# Patient Record
Sex: Female | Born: 1955 | Race: Black or African American | Hispanic: No | Marital: Married | Smoking: Former smoker
Health system: Southern US, Community
[De-identification: ages and names within clinical notes are randomized; demographics above are authoritative.]

## PROBLEM LIST (undated history)

## (undated) DIAGNOSIS — I10 Essential (primary) hypertension: Secondary | ICD-10-CM

## (undated) DIAGNOSIS — M1990 Unspecified osteoarthritis, unspecified site: Secondary | ICD-10-CM

## (undated) DIAGNOSIS — R9430 Abnormal result of cardiovascular function study, unspecified: Secondary | ICD-10-CM

## (undated) DIAGNOSIS — T8859XA Other complications of anesthesia, initial encounter: Secondary | ICD-10-CM

## (undated) DIAGNOSIS — IMO0002 Reserved for concepts with insufficient information to code with codable children: Secondary | ICD-10-CM

## (undated) DIAGNOSIS — R51 Headache: Secondary | ICD-10-CM

## (undated) DIAGNOSIS — T4145XA Adverse effect of unspecified anesthetic, initial encounter: Secondary | ICD-10-CM

## (undated) DIAGNOSIS — J069 Acute upper respiratory infection, unspecified: Secondary | ICD-10-CM

## (undated) DIAGNOSIS — K219 Gastro-esophageal reflux disease without esophagitis: Secondary | ICD-10-CM

## (undated) HISTORY — PX: JOINT REPLACEMENT: SHX530

## (undated) HISTORY — PX: SUPERIOR OBLIQUE TUCK: SHX2465

## (undated) HISTORY — DX: Abnormal result of cardiovascular function study, unspecified: R94.30

## (undated) HISTORY — PX: TUBAL LIGATION: SHX77

## (undated) HISTORY — DX: Essential (primary) hypertension: I10

## (undated) HISTORY — DX: Reserved for concepts with insufficient information to code with codable children: IMO0002

---

## 1999-10-29 ENCOUNTER — Ambulatory Visit (HOSPITAL_COMMUNITY): Admission: RE | Admit: 1999-10-29 | Discharge: 1999-10-29 | Payer: Self-pay | Admitting: Cardiology

## 2003-10-17 ENCOUNTER — Inpatient Hospital Stay (HOSPITAL_COMMUNITY): Admission: RE | Admit: 2003-10-17 | Discharge: 2003-10-20 | Payer: Self-pay | Admitting: Orthopedic Surgery

## 2011-05-29 ENCOUNTER — Other Ambulatory Visit: Payer: Self-pay | Admitting: Orthopedic Surgery

## 2011-05-29 ENCOUNTER — Encounter (HOSPITAL_COMMUNITY): Payer: Self-pay | Admitting: Pharmacy Technician

## 2011-06-03 ENCOUNTER — Other Ambulatory Visit: Payer: Self-pay

## 2011-06-03 ENCOUNTER — Encounter (HOSPITAL_COMMUNITY)
Admission: RE | Admit: 2011-06-03 | Discharge: 2011-06-03 | Disposition: A | Discharge status: Home / Self Care | Payer: BC Managed Care – PPO | Source: Ambulatory Visit | Attending: Orthopedic Surgery | Admitting: Orthopedic Surgery

## 2011-06-03 ENCOUNTER — Encounter (HOSPITAL_COMMUNITY): Payer: Self-pay

## 2011-06-03 ENCOUNTER — Ambulatory Visit (HOSPITAL_COMMUNITY)
Admission: RE | Admit: 2011-06-03 | Discharge: 2011-06-03 | Disposition: A | Discharge status: Home / Self Care | Payer: BC Managed Care – PPO | Source: Ambulatory Visit | Attending: Orthopedic Surgery | Admitting: Orthopedic Surgery

## 2011-06-03 DIAGNOSIS — I517 Cardiomegaly: Secondary | ICD-10-CM | POA: Insufficient documentation

## 2011-06-03 DIAGNOSIS — I10 Essential (primary) hypertension: Secondary | ICD-10-CM | POA: Insufficient documentation

## 2011-06-03 DIAGNOSIS — Z01810 Encounter for preprocedural cardiovascular examination: Secondary | ICD-10-CM | POA: Insufficient documentation

## 2011-06-03 DIAGNOSIS — Z01812 Encounter for preprocedural laboratory examination: Secondary | ICD-10-CM | POA: Insufficient documentation

## 2011-06-03 DIAGNOSIS — E119 Type 2 diabetes mellitus without complications: Secondary | ICD-10-CM | POA: Insufficient documentation

## 2011-06-03 DIAGNOSIS — Z01818 Encounter for other preprocedural examination: Secondary | ICD-10-CM | POA: Insufficient documentation

## 2011-06-03 HISTORY — DX: Other complications of anesthesia, initial encounter: T88.59XA

## 2011-06-03 HISTORY — DX: Headache: R51

## 2011-06-03 HISTORY — DX: Essential (primary) hypertension: I10

## 2011-06-03 HISTORY — DX: Unspecified osteoarthritis, unspecified site: M19.90

## 2011-06-03 HISTORY — DX: Adverse effect of unspecified anesthetic, initial encounter: T41.45XA

## 2011-06-03 HISTORY — DX: Gastro-esophageal reflux disease without esophagitis: K21.9

## 2011-06-03 HISTORY — DX: Acute upper respiratory infection, unspecified: J06.9

## 2011-06-03 LAB — URINALYSIS, ROUTINE W REFLEX MICROSCOPIC
Bilirubin Urine: NEGATIVE
Glucose, UA: NEGATIVE mg/dL
Ketones, ur: NEGATIVE mg/dL
pH: 6.5 (ref 5.0–8.0)

## 2011-06-03 LAB — CBC
HCT: 40.9 % (ref 36.0–46.0)
Hemoglobin: 13.6 g/dL (ref 12.0–15.0)
MCHC: 33.3 g/dL (ref 30.0–36.0)

## 2011-06-03 LAB — COMPREHENSIVE METABOLIC PANEL
Alkaline Phosphatase: 93 U/L (ref 39–117)
BUN: 9 mg/dL (ref 6–23)
GFR calc Af Amer: >90 mL/min (ref >90)
GFR calc non Af Amer: >90 mL/min (ref >90)
Glucose, Bld: 141 mg/dL — ABNORMAL HIGH (ref 70–99)
Potassium: 3.8 mEq/L (ref 3.5–5.1)
Total Bilirubin: 0.3 mg/dL (ref 0.3–1.2)
Total Protein: 7.3 g/dL (ref 6.0–8.3)

## 2011-06-03 LAB — DIFFERENTIAL
Basophils Absolute: 0 10*3/uL (ref 0.0–0.1)
Basophils Relative: 0 % (ref 0–1)
Eosinophils Absolute: 0.1 10*3/uL (ref 0.0–0.7)
Lymphs Abs: 3.6 10*3/uL (ref 0.7–4.0)
Neutrophils Relative %: 47 % (ref 43–77)

## 2011-06-03 LAB — APTT: aPTT: 30 seconds (ref 24–37)

## 2011-06-03 LAB — SURGICAL PCR SCREEN: MRSA, PCR: NEGATIVE

## 2011-06-03 LAB — PROTIME-INR
INR: 0.84 (ref 0.00–1.49)
Prothrombin Time: 11.7 seconds (ref 11.6–15.2)

## 2011-06-03 LAB — URINE MICROSCOPIC-ADD ON

## 2011-06-03 LAB — ABO/RH: ABO/RH(D): AB POS

## 2011-06-03 LAB — HCG, SERUM, QUALITATIVE: Preg, Serum: NEGATIVE

## 2011-06-03 NOTE — Progress Notes (Signed)
Call to [facility], they report that they did stress test but its only her PCP, Dr. Sherral Hammers that can released this info.  Call to [facility]  Spoke with recording at [facility].  Requested ekg, stress test result, & OV note. Asked that it be faxed to [phone]

## 2011-06-03 NOTE — Pre-Procedure Instructions (Signed)
20 Catherine Maxwell  06/03/2011   Your procedure is scheduled on:  06/10/2011  Report to [facility] at 6:45 AM.  Call this number if you have problems the morning of surgery: [phone]   Remember:   Do not eat food:After Midnight.  May have clear liquids: up to 4 Hours before arrival.  Clear liquids include soda, tea, black coffee, apple or grape juice, broth.  Take these medicines the morning of surgery with A SIP OF WATER: Bystolic & pain medicine if needed   Do not wear jewelry, make-up or nail polish.  Do not wear lotions, powders, or perfumes. You may wear deodorant.  Do not shave 48 hours prior to surgery.  Do not bring valuables to the hospital.  Contacts, dentures or bridgework may not be worn into surgery.  Leave suitcase in the car. After surgery it may be brought to your room.  For patients admitted to the hospital, checkout time is 11:00 AM the day of discharge.   Patients discharged the day of surgery will not be allowed to drive home.  Name and phone number of your driver: spouse  Special Instructions: Incentive Spirometry - Practice and bring it with you on the day of surgery. and CHG Shower Use Special Wash: 1/2 bottle night before surgery and 1/2 bottle morning of surgery.   Please read over the following fact sheets that you were given: Pain Booklet, Coughing and Deep Breathing, Blood Transfusion Information, Total Joint Packet, MRSA Information and Surgical Site Infection Prevention

## 2011-06-04 LAB — URINE CULTURE
Colony Count: NO GROWTH
Culture  Setup Time: 201212101639

## 2011-06-04 NOTE — Consult Note (Signed)
Anesthesia:  Patient is a 55 year old female scheduled for a right TKA on 06/10/11.  Her history includes HTN, DM2, GERD, headaches, former smoker, and OA.    I was asked to review her preoperative CXR showing: Enlargement of cardiac silhouette. Bronchitic changes with question 7 mm diameter right upper lobe nodular density.  The Radiologist recommended a chest CT to exclude a pulmonary nodule.  The Radiologist Assistant was to notify the surgeon.  Defer timing of the CT scan to Dr. Sherlean Foot.  I also noted her preoperative EKG showing SR with rather diffuse T wave inversion, suggesting possible inferior, anterior, and lateral ischemia.  Her PCP Dr. Sherral Hammers had actually sent her for a stress test in April of 2010 at [facility] for an abnormal EKG from March of 2010 also showing inferior-lateral T wave abnormality.  Anterior T wave changes are somewhat more prominent now but still probably non-specific on her current EKG.  She did not report any symptoms of chest pain.  Her stress test report indicates no ischemia seen on images, EF 77%.  If no new cardiopulmonary symptoms, then anticipate she can proceed.  Labs reviewed.  Her chart was placed in the follow-up cabinet as her urine culture is still pending,

## 2011-06-09 MED ORDER — CEFAZOLIN SODIUM-DEXTROSE 2-3 GM-% IV SOLR
2.0000 g | INTRAVENOUS | Status: AC
  Administered 2011-06-10: 2 g via INTRAVENOUS
  Filled 2011-06-09: qty 50

## 2011-06-10 ENCOUNTER — Encounter (HOSPITAL_COMMUNITY): Payer: Self-pay | Admitting: *Deleted

## 2011-06-10 ENCOUNTER — Inpatient Hospital Stay (HOSPITAL_COMMUNITY)
Admission: RE | Admit: 2011-06-10 | Discharge: 2011-06-12 | DRG: 209 | Disposition: A | Discharge status: Home / Self Care | Payer: BC Managed Care – PPO | Source: Ambulatory Visit | Attending: Orthopedic Surgery | Admitting: Orthopedic Surgery

## 2011-06-10 ENCOUNTER — Inpatient Hospital Stay (HOSPITAL_COMMUNITY): Payer: BC Managed Care – PPO | Admitting: Vascular Surgery

## 2011-06-10 ENCOUNTER — Encounter (HOSPITAL_COMMUNITY): Payer: Self-pay | Admitting: Vascular Surgery

## 2011-06-10 ENCOUNTER — Encounter (HOSPITAL_COMMUNITY)
Admission: RE | Disposition: A | Discharge status: Home / Self Care | Payer: Self-pay | Source: Ambulatory Visit | Attending: Orthopedic Surgery

## 2011-06-10 DIAGNOSIS — D62 Acute posthemorrhagic anemia: Secondary | ICD-10-CM | POA: Diagnosis not present

## 2011-06-10 DIAGNOSIS — M1711 Unilateral primary osteoarthritis, right knee: Secondary | ICD-10-CM

## 2011-06-10 DIAGNOSIS — M1710 Unilateral primary osteoarthritis, unspecified knee: Principal | ICD-10-CM | POA: Diagnosis present

## 2011-06-10 DIAGNOSIS — Z87891 Personal history of nicotine dependence: Secondary | ICD-10-CM

## 2011-06-10 HISTORY — PX: TOTAL KNEE ARTHROPLASTY: SHX125

## 2011-06-10 LAB — GLUCOSE, CAPILLARY
Glucose-Capillary: 127 mg/dL — ABNORMAL HIGH (ref 70–99)
Glucose-Capillary: 152 mg/dL — ABNORMAL HIGH (ref 70–99)

## 2011-06-10 SURGERY — ARTHROPLASTY, KNEE, TOTAL
Anesthesia: Regional | Site: Knee | Laterality: Right | Wound class: Clean

## 2011-06-10 MED ORDER — ONDANSETRON HCL 4 MG/2ML IJ SOLN: 4.0000 mg | Freq: Four times a day (QID) | INTRAMUSCULAR | Status: DC | PRN

## 2011-06-10 MED ORDER — MIDAZOLAM HCL 5 MG/5ML IJ SOLN
INTRAMUSCULAR | Status: DC | PRN
  Administered 2011-06-10: 2 mg via INTRAVENOUS

## 2011-06-10 MED ORDER — ACETAMINOPHEN 650 MG RE SUPP: 650.0000 mg | Freq: Four times a day (QID) | RECTAL | Status: DC | PRN

## 2011-06-10 MED ORDER — FLEET ENEMA 7-19 GM/118ML RE ENEM: 1.0000 | ENEMA | Freq: Once | RECTAL | Status: AC | PRN

## 2011-06-10 MED ORDER — CEFAZOLIN SODIUM-DEXTROSE 2-3 GM-% IV SOLR
2.0000 g | Freq: Four times a day (QID) | INTRAVENOUS | Status: AC
  Administered 2011-06-10 – 2011-06-11 (×3): 2 g via INTRAVENOUS
  Filled 2011-06-10 (×3): qty 50

## 2011-06-10 MED ORDER — BUPIVACAINE 0.25 % ON-Q PUMP SINGLE CATH 300ML
INJECTION | Status: DC | PRN
  Administered 2011-06-10: 300 mL

## 2011-06-10 MED ORDER — LISINOPRIL 20 MG PO TABS
20.0000 mg | ORAL_TABLET | Freq: Every day | ORAL | Status: DC
  Administered 2011-06-10 – 2011-06-12 (×3): 20 mg via ORAL
  Filled 2011-06-10 (×3): qty 1

## 2011-06-10 MED ORDER — ONDANSETRON HCL 4 MG/2ML IJ SOLN
INTRAMUSCULAR | Status: DC | PRN
  Administered 2011-06-10: 4 mg via INTRAVENOUS

## 2011-06-10 MED ORDER — ACETAMINOPHEN 325 MG PO TABS
650.0000 mg | ORAL_TABLET | Freq: Four times a day (QID) | ORAL | Status: DC | PRN
  Administered 2011-06-11 – 2011-06-12 (×2): 650 mg via ORAL
  Filled 2011-06-10 (×2): qty 2

## 2011-06-10 MED ORDER — NEBIVOLOL HCL 10 MG PO TABS
10.0000 mg | ORAL_TABLET | Freq: Every day | ORAL | Status: DC
  Administered 2011-06-11 – 2011-06-12 (×2): 10 mg via ORAL
  Filled 2011-06-10 (×3): qty 1

## 2011-06-10 MED ORDER — METOCLOPRAMIDE HCL 10 MG PO TABS: 5.0000 mg | ORAL_TABLET | Freq: Three times a day (TID) | ORAL | Status: DC | PRN

## 2011-06-10 MED ORDER — ALUM & MAG HYDROXIDE-SIMETH 200-200-20 MG/5ML PO SUSP: 30.0000 mL | ORAL | Status: DC | PRN

## 2011-06-10 MED ORDER — PROPOFOL 10 MG/ML IV EMUL
INTRAVENOUS | Status: DC | PRN
  Administered 2011-06-10: 40 mg via INTRAVENOUS
  Administered 2011-06-10: 160 mg via INTRAVENOUS

## 2011-06-10 MED ORDER — INSULIN ASPART 100 UNIT/ML ~~LOC~~ SOLN
0.0000 [IU] | Freq: Three times a day (TID) | SUBCUTANEOUS | Status: DC
  Filled 2011-06-10: qty 3

## 2011-06-10 MED ORDER — AMLODIPINE-OLMESARTAN 10-40 MG PO TABS: 1.0000 | ORAL_TABLET | Freq: Every day | ORAL | Status: DC

## 2011-06-10 MED ORDER — LACTATED RINGERS IV SOLN
INTRAVENOUS | Status: DC | PRN
  Administered 2011-06-10 (×2): via INTRAVENOUS

## 2011-06-10 MED ORDER — ACETAMINOPHEN 80 MG PO CHEW: 320.0000 mg | CHEWABLE_TABLET | Freq: Four times a day (QID) | ORAL | Status: DC | PRN

## 2011-06-10 MED ORDER — BUPIVACAINE 0.25 % ON-Q PUMP SINGLE CATH 300ML
300.0000 mL | INJECTION | Status: DC
Start: 2011-06-10 — End: 2011-06-10
  Filled 2011-06-10: qty 300

## 2011-06-10 MED ORDER — BISACODYL 5 MG PO TBEC: 5.0000 mg | DELAYED_RELEASE_TABLET | Freq: Every day | ORAL | Status: DC | PRN

## 2011-06-10 MED ORDER — ONDANSETRON HCL 4 MG PO TABS: 4.0000 mg | ORAL_TABLET | Freq: Four times a day (QID) | ORAL | Status: DC | PRN

## 2011-06-10 MED ORDER — OXYCODONE HCL 5 MG PO TABS
5.0000 mg | ORAL_TABLET | ORAL | Status: DC | PRN
  Administered 2011-06-11 – 2011-06-12 (×6): 10 mg via ORAL
  Filled 2011-06-10 (×7): qty 2

## 2011-06-10 MED ORDER — METOCLOPRAMIDE HCL 5 MG/ML IJ SOLN
5.0000 mg | Freq: Three times a day (TID) | INTRAMUSCULAR | Status: DC | PRN
  Filled 2011-06-10: qty 2

## 2011-06-10 MED ORDER — DEXTROSE 5 % IV SOLN
500.0000 mg | Freq: Four times a day (QID) | INTRAVENOUS | Status: DC | PRN
  Filled 2011-06-10: qty 5

## 2011-06-10 MED ORDER — DIPHENHYDRAMINE HCL 12.5 MG/5ML PO ELIX
12.5000 mg | ORAL_SOLUTION | ORAL | Status: DC | PRN
  Filled 2011-06-10: qty 10

## 2011-06-10 MED ORDER — MENTHOL 3 MG MT LOZG
1.0000 | LOZENGE | OROMUCOSAL | Status: DC | PRN
  Filled 2011-06-10: qty 9

## 2011-06-10 MED ORDER — MECLIZINE HCL 25 MG PO TABS
25.0000 mg | ORAL_TABLET | Freq: Three times a day (TID) | ORAL | Status: DC | PRN
  Filled 2011-06-10: qty 1

## 2011-06-10 MED ORDER — OXYCODONE HCL 10 MG PO TB12
10.0000 mg | ORAL_TABLET | Freq: Two times a day (BID) | ORAL | Status: DC
  Administered 2011-06-10 – 2011-06-11 (×4): 10 mg via ORAL
  Filled 2011-06-10 (×5): qty 1

## 2011-06-10 MED ORDER — BUPIVACAINE-EPINEPHRINE 0.25% -1:200000 IJ SOLN
INTRAMUSCULAR | Status: DC | PRN
  Administered 2011-06-10: 20 mL

## 2011-06-10 MED ORDER — INSULIN ASPART 100 UNIT/ML ~~LOC~~ SOLN: 0.0000 [IU] | Freq: Every day | SUBCUTANEOUS | Status: DC

## 2011-06-10 MED ORDER — HYDROMORPHONE HCL PF 1 MG/ML IJ SOLN
0.2500 mg | INTRAMUSCULAR | Status: DC | PRN
  Administered 2011-06-10 (×2): 0.5 mg via INTRAVENOUS

## 2011-06-10 MED ORDER — DROPERIDOL 2.5 MG/ML IJ SOLN: 0.6250 mg | INTRAMUSCULAR | Status: DC | PRN

## 2011-06-10 MED ORDER — FENTANYL CITRATE 0.05 MG/ML IJ SOLN
INTRAMUSCULAR | Status: DC | PRN
  Administered 2011-06-10: 100 ug via INTRAVENOUS
  Administered 2011-06-10 (×2): 50 ug via INTRAVENOUS
  Administered 2011-06-10: 100 ug via INTRAVENOUS
  Administered 2011-06-10: 50 ug via INTRAVENOUS

## 2011-06-10 MED ORDER — ACETAMINOPHEN 10 MG/ML IV SOLN: 1000.0000 mg | Freq: Four times a day (QID) | INTRAVENOUS | Status: DC

## 2011-06-10 MED ORDER — AMLODIPINE BESYLATE 10 MG PO TABS
10.0000 mg | ORAL_TABLET | Freq: Every day | ORAL | Status: DC
  Administered 2011-06-10 – 2011-06-12 (×3): 10 mg via ORAL
  Filled 2011-06-10 (×3): qty 1

## 2011-06-10 MED ORDER — CHLORHEXIDINE GLUCONATE 4 % EX LIQD: 60.0000 mL | Freq: Once | CUTANEOUS | Status: DC

## 2011-06-10 MED ORDER — SENNOSIDES-DOCUSATE SODIUM 8.6-50 MG PO TABS: 1.0000 | ORAL_TABLET | Freq: Every evening | ORAL | Status: DC | PRN

## 2011-06-10 MED ORDER — PHENOL 1.4 % MT LIQD
1.0000 | OROMUCOSAL | Status: DC | PRN
  Filled 2011-06-10: qty 177

## 2011-06-10 MED ORDER — OLMESARTAN MEDOXOMIL 40 MG PO TABS
40.0000 mg | ORAL_TABLET | Freq: Every day | ORAL | Status: DC
  Administered 2011-06-10 – 2011-06-12 (×3): 40 mg via ORAL
  Filled 2011-06-10 (×3): qty 1

## 2011-06-10 MED ORDER — ENOXAPARIN SODIUM 40 MG/0.4ML ~~LOC~~ SOLN
40.0000 mg | SUBCUTANEOUS | Status: DC
  Administered 2011-06-11 – 2011-06-12 (×2): 40 mg via SUBCUTANEOUS
  Filled 2011-06-10 (×3): qty 0.4

## 2011-06-10 MED ORDER — LISINOPRIL-HYDROCHLOROTHIAZIDE 20-25 MG PO TABS: 1.0000 | ORAL_TABLET | Freq: Every day | ORAL | Status: DC

## 2011-06-10 MED ORDER — HYDROCHLOROTHIAZIDE 25 MG PO TABS
25.0000 mg | ORAL_TABLET | Freq: Every day | ORAL | Status: DC
  Administered 2011-06-10 – 2011-06-11 (×2): 25 mg via ORAL
  Filled 2011-06-10 (×3): qty 1

## 2011-06-10 MED ORDER — BUPIVACAINE-EPINEPHRINE PF 0.5-1:200000 % IJ SOLN
INTRAMUSCULAR | Status: DC | PRN
  Administered 2011-06-10: 30 mL

## 2011-06-10 MED ORDER — SODIUM CHLORIDE 0.9 % IV SOLN
INTRAVENOUS | Status: DC
  Administered 2011-06-10 – 2011-06-11 (×2): via INTRAVENOUS

## 2011-06-10 MED ORDER — BUPIVACAINE ON-Q PAIN PUMP (FOR ORDER SET NO CHG)
INJECTION | Status: DC
  Filled 2011-06-10: qty 1

## 2011-06-10 MED ORDER — HYDROMORPHONE HCL PF 1 MG/ML IJ SOLN
0.5000 mg | INTRAMUSCULAR | Status: DC | PRN
  Administered 2011-06-10 (×2): 1 mg via INTRAVENOUS
  Filled 2011-06-10 (×2): qty 1

## 2011-06-10 MED ORDER — SODIUM CHLORIDE 0.9 % IR SOLN
Status: DC | PRN
  Administered 2011-06-10: 3000 mL

## 2011-06-10 MED ORDER — METHOCARBAMOL 100 MG/ML IJ SOLN
500.0000 mg | INTRAMUSCULAR | Status: AC
  Administered 2011-06-10: 500 mg via INTRAVENOUS
  Filled 2011-06-10: qty 5

## 2011-06-10 MED ORDER — METHOCARBAMOL 500 MG PO TABS
500.0000 mg | ORAL_TABLET | Freq: Four times a day (QID) | ORAL | Status: DC | PRN
  Administered 2011-06-10 – 2011-06-12 (×5): 500 mg via ORAL
  Filled 2011-06-10 (×5): qty 1

## 2011-06-10 MED ORDER — SODIUM CHLORIDE 0.9 % IV SOLN: INTRAVENOUS | Status: DC

## 2011-06-10 MED ORDER — DOCUSATE SODIUM 100 MG PO CAPS
100.0000 mg | ORAL_CAPSULE | Freq: Two times a day (BID) | ORAL | Status: DC
  Administered 2011-06-10 – 2011-06-12 (×5): 100 mg via ORAL
  Filled 2011-06-10 (×5): qty 1

## 2011-06-10 MED ORDER — ZOLPIDEM TARTRATE 5 MG PO TABS: 5.0000 mg | ORAL_TABLET | Freq: Every evening | ORAL | Status: DC | PRN

## 2011-06-10 SURGICAL SUPPLY — 64 items
BANDAGE ESMARK 6X9 LF (GAUZE/BANDAGES/DRESSINGS) ×1 IMPLANT
BLADE SAGITTAL 13X1.27X60 (BLADE) ×2 IMPLANT
BLADE SAW SGTL 83.5X18.5 (BLADE) ×2 IMPLANT
BNDG ESMARK 6X9 LF (GAUZE/BANDAGES/DRESSINGS) ×2
BOWL SMART MIX CTS (DISPOSABLE) ×2 IMPLANT
CATH KIT ON Q 10IN SLV (PAIN MANAGEMENT) ×2 IMPLANT
CEMENT BONE SIMPLEX SPEEDSET (Cement) ×4 IMPLANT
CLOTH BEACON ORANGE TIMEOUT ST (SAFETY) ×2 IMPLANT
COVER BACK TABLE 24X17X13 BIG (DRAPES) ×2 IMPLANT
COVER SURGICAL LIGHT HANDLE (MISCELLANEOUS) ×2 IMPLANT
CUFF TOURNIQUET SINGLE 34IN LL (TOURNIQUET CUFF) ×2 IMPLANT
DRAPE EXTREMITY T 121X128X90 (DRAPE) ×2 IMPLANT
DRAPE INCISE IOBAN 66X45 STRL (DRAPES) ×4 IMPLANT
DRAPE PROXIMA HALF (DRAPES) ×2 IMPLANT
DRAPE U-SHAPE 47X51 STRL (DRAPES) ×2 IMPLANT
DRSG ADAPTIC 3X8 NADH LF (GAUZE/BANDAGES/DRESSINGS) ×2 IMPLANT
DRSG PAD ABDOMINAL 8X10 ST (GAUZE/BANDAGES/DRESSINGS) ×2 IMPLANT
DURAPREP 26ML APPLICATOR (WOUND CARE) ×4 IMPLANT
ELECT REM PT RETURN 9FT ADLT (ELECTROSURGICAL) ×2
ELECTRODE REM PT RTRN 9FT ADLT (ELECTROSURGICAL) ×1 IMPLANT
EVACUATOR 1/8 PVC DRAIN (DRAIN) ×2 IMPLANT
GAUZE SPONGE 4X4 12PLY STRL LF (GAUZE/BANDAGES/DRESSINGS) ×2 IMPLANT
GLOVE BIO SURGEON STRL SZ 6.5 (GLOVE) ×2 IMPLANT
GLOVE BIO SURGEON STRL SZ7.5 (GLOVE) ×2 IMPLANT
GLOVE BIO SURGEON STRL SZ8 (GLOVE) ×2 IMPLANT
GLOVE BIOGEL M 7.0 STRL (GLOVE) IMPLANT
GLOVE BIOGEL PI IND STRL 7.5 (GLOVE) ×1 IMPLANT
GLOVE BIOGEL PI IND STRL 8.5 (GLOVE) ×2 IMPLANT
GLOVE BIOGEL PI INDICATOR 7.5 (GLOVE) ×1
GLOVE BIOGEL PI INDICATOR 8.5 (GLOVE) ×2
GLOVE NEODERM STER SZ 7 (GLOVE) ×2 IMPLANT
GLOVE SURG ORTHO 8.0 STRL STRW (GLOVE) ×4 IMPLANT
GOWN PREVENTION PLUS XLARGE (GOWN DISPOSABLE) ×4 IMPLANT
GOWN STRL NON-REIN LRG LVL3 (GOWN DISPOSABLE) ×4 IMPLANT
HANDPIECE INTERPULSE COAX TIP (DISPOSABLE) ×1
HOOD PEEL AWAY FACE SHEILD DIS (HOOD) ×6 IMPLANT
IMMOBILIZER KNEE 22 UNIV (SOFTGOODS) ×2 IMPLANT
KIT BASIN OR (CUSTOM PROCEDURE TRAY) ×2 IMPLANT
KIT ROOM TURNOVER OR (KITS) ×2 IMPLANT
MANIFOLD NEPTUNE II (INSTRUMENTS) ×2 IMPLANT
NEEDLE 22X1 1/2 (OR ONLY) (NEEDLE) IMPLANT
NS IRRIG 1000ML POUR BTL (IV SOLUTION) ×2 IMPLANT
PACK TOTAL JOINT (CUSTOM PROCEDURE TRAY) ×2 IMPLANT
PAD ARMBOARD 7.5X6 YLW CONV (MISCELLANEOUS) ×4 IMPLANT
PADDING CAST COTTON 6X4 STRL (CAST SUPPLIES) ×2 IMPLANT
PADDING WEBRIL 6 STERILE (GAUZE/BANDAGES/DRESSINGS) ×2 IMPLANT
POSITIONER HEAD PRONE TRACH (MISCELLANEOUS) ×2 IMPLANT
SET HNDPC FAN SPRY TIP SCT (DISPOSABLE) ×1 IMPLANT
SPONGE GAUZE 4X4 12PLY (GAUZE/BANDAGES/DRESSINGS) ×2 IMPLANT
STAPLER VISISTAT 35W (STAPLE) ×2 IMPLANT
SUCTION FRAZIER TIP 10 FR DISP (SUCTIONS) ×2 IMPLANT
SUT BONE WAX W31G (SUTURE) ×2 IMPLANT
SUT VIC AB 0 CT1 27 (SUTURE) ×1
SUT VIC AB 0 CT1 27XBRD ANBCTR (SUTURE) ×1 IMPLANT
SUT VIC AB 0 CTB1 27 (SUTURE) ×4 IMPLANT
SUT VIC AB 1 CT1 27 (SUTURE) ×4
SUT VIC AB 1 CT1 27XBRD ANBCTR (SUTURE) ×4 IMPLANT
SUT VIC AB 2-0 CT1 27 (SUTURE) ×2
SUT VIC AB 2-0 CT1 TAPERPNT 27 (SUTURE) ×2 IMPLANT
SYR CONTROL 10ML LL (SYRINGE) IMPLANT
TOWEL OR 17X24 6PK STRL BLUE (TOWEL DISPOSABLE) ×2 IMPLANT
TOWEL OR 17X26 10 PK STRL BLUE (TOWEL DISPOSABLE) ×2 IMPLANT
TRAY FOLEY CATH 14FR (SET/KITS/TRAYS/PACK) ×2 IMPLANT
WATER STERILE IRR 1000ML POUR (IV SOLUTION) ×6 IMPLANT

## 2011-06-10 NOTE — Transfer of Care (Signed)
Immediate Anesthesia Transfer of Care Note  Patient: Catherine Maxwell  Procedure(s) Performed:  TOTAL KNEE ARTHROPLASTY  Patient Location: PACU  Anesthesia Type: General  Level of Consciousness: awake, alert  and oriented  Airway & Oxygen Therapy: Patient Spontanous Breathing and Patient connected to nasal cannula oxygen  Post-op Assessment: Report given to PACU RN, Post -op Vital signs reviewed and stable and Patient moving all extremities X 4  Post vital signs: Reviewed and stable  Complications: No apparent anesthesia complications

## 2011-06-10 NOTE — Anesthesia Preprocedure Evaluation (Addendum)
Anesthesia Evaluation  Patient identified by MRN, date of birth, ID band Patient awake    Reviewed: Allergy & Precautions, H&P , NPO status , Patient's Chart, lab work & pertinent test results, reviewed documented beta blocker date and time   Airway Mallampati: II TM Distance: >3 FB Neck ROM: Full    Dental  (+) Teeth Intact   Pulmonary Recent URI , Resolved,  clear to auscultation  Pulmonary exam normal       Cardiovascular hypertension, Pt. on home beta blockers and Pt. on medications Regular Normal- Systolic murmurs    Neuro/Psych  Headaches,    GI/Hepatic GERD-  Controlled,  Endo/Other  Diabetes mellitus-, Type 2, Oral Hypoglycemic Agents  Renal/GU      Musculoskeletal   Abdominal   Peds  Hematology   Anesthesia Other Findings   Reproductive/Obstetrics                          Anesthesia Physical Anesthesia Plan  ASA: II  Anesthesia Plan: General   Post-op Pain Management:    Induction: Intravenous  Airway Management Planned: LMA  Additional Equipment:   Intra-op Plan:   Post-operative Plan:   Informed Consent: I have reviewed the patients History and Physical, chart, labs and discussed the procedure including the risks, benefits and alternatives for the proposed anesthesia with the patient or authorized representative who has indicated his/her understanding and acceptance.     Plan Discussed with: CRNA and Surgeon  Anesthesia Plan Comments:         Anesthesia Quick Evaluation

## 2011-06-10 NOTE — Preoperative (Signed)
Beta Blockers   Reason not to administer Beta Blockers:Pt took Bystolic 06/10/11 @ 0600

## 2011-06-10 NOTE — Anesthesia Postprocedure Evaluation (Signed)
Anesthesia Post Note  Patient: Catherine Maxwell  Procedure(s) Performed:  TOTAL KNEE ARTHROPLASTY  Anesthesia type: General  Patient location: PACU  Post pain: Pain level controlled  Post assessment: Patient's Cardiovascular Status Stable  Last Vitals:  Filed Vitals:   06/10/11 1308  BP: 133/73  Pulse: 68  Temp:   Resp: 16    Post vital signs: Reviewed and stable  Level of consciousness: sedated  Complications: No apparent anesthesia complications

## 2011-06-10 NOTE — Anesthesia Procedure Notes (Addendum)
Anesthesia Regional Block:  Femoral nerve block  Pre-Anesthetic Checklist: ,, timeout performed, Correct Patient, Correct Site, Correct Laterality, Correct Procedure,, site marked, risks and benefits discussed, Surgical consent,  Pre-op evaluation,  At surgeon's request and post-op pain management  Laterality: Right  Prep: chloraprep       Needles:  Injection technique: Single-shot  Needle Type: Echogenic Stimulator Needle     Needle Length: 5cm 5 cm Needle Gauge: 22 and 22 G    Additional Needles:  Procedures: ultrasound guided and nerve stimulator Femoral nerve block  Nerve Stimulator or Paresthesia:  Response: quadraceps contraction, 0.45 mA,   Additional Responses:   Narrative:  Start time: 06/10/2011 8:30 AM End time: 06/10/2011 8:40 AM Injection made incrementally with aspirations every 5 mL.  Performed by: Personally  Anesthesiologist: Halford Decamp, MD  Additional Notes: Functioning IV was confirmed and monitors were applied.  A 50mm 22ga Arrow echogenic stimulator needle was used. Sterile prep and drape,hand hygiene and sterile gloves were used. Ultrasound guidance: relevent anatomy identified, needle position confirmed, local anesthetic spread visualized around nerve(s)., vascular puncture avoided.  Image printed for medical record. Negative aspiration and negative test dose prior to incremental administration of local anesthetic. The patient tolerated the procedure well.    Femoral nerve block Procedure Name: LMA Insertion Date/Time: 06/10/2011 9:00 AM Performed by: Rossie Muskrat Pre-anesthesia Checklist: Patient identified, Timeout performed, Emergency Drugs available, Suction available and Patient being monitored Patient Re-evaluated:Patient Re-evaluated prior to inductionOxygen Delivery Method: Circle System Utilized Preoxygenation: Pre-oxygenation with 100% oxygen Intubation Type: IV induction Ventilation: Mask ventilation with difficulty LMA: LMA  inserted LMA Size: 4.0 Number of attempts: 1 Placement Confirmation: breath sounds checked- equal and bilateral and positive ETCO2 Tube secured with: Tape Dental Injury: Teeth and Oropharynx as per pre-operative assessment

## 2011-06-10 NOTE — Op Note (Signed)
TOTAL KNEE REPLACEMENT OPERATIVE NOTE:  06/10/2011  10:04 PM  PATIENT:  Catherine Maxwell  55 y.o. female  PRE-OPERATIVE DIAGNOSIS:  OA RIGHT KNEE  POST-OPERATIVE DIAGNOSIS:  Osteoarthritis Right Knee  PROCEDURE:  Procedure(s): TOTAL KNEE ARTHROPLASTY  SURGEON:  Surgeon(s): Raymon Mutton, MD  PHYSICIAN ASSISTANT: Altamese Cabal, [facility]  ANESTHESIA:   general  DRAINS: Hemovac and On-Q Marcaine Pain Pump  SPECIMEN: None  COUNTS:  Correct  TOURNIQUET:   Total Tourniquet Time Documented: Thigh (Right) - 78 minutes  DICTATION:  Indication for procedure:    The patient is a 55 y.o. female who has failed conservative treatment for OA RIGHT KNEE.  Informed consent was obtained prior to anesthesia. The risks versus benefits of the operation were explain and in a way the patient can, and did, understand.   Description of procedure:     The patient was taken to the operating room and placed under anesthesia.  The patient was positioned in the usual fashion taking care that all body parts were adequately padded and/or protected.  I foley catheter was placed.  A tourniquet was applied and the leg prepped and draped in the usual sterile fashion.  The extremity was exsanguinated with the esmarch and tourniquet inflated to 350 mmHg.  Pre-operative range of motion was normal.  The knee was in 3 degree of mild valgus.  A midline incision approximately 6-7 inches long was made with a #10 blade.  A new blade was used to make a parapatellar arthrotomy going 2-3 cm into the quadriceps tendon, over the patella, and alongside the medial aspect of the patellar tendon.  A synovectomy was then performed with the #10 blade and forceps. I then elevated the deep MCL off the medial tibial metaphysis subperiosteally around to the semimembranosus attachment.    I everted the patella and used calipers to measure patellar thickness, which was ###.  I used the reamer to ream down to appropriate thickness to  recreated the native thickness.  I then removed excess bone with the rongeur and sagittal saw.  I used the ## mm template and drilled the three lug holes.  I then put the trial in place and measured the thickness with the calipers to ensure recreation of the native thickness.  The trial was then removed and the patella subluxed and the knee brought into flexion.  A homan retractor was place to retract and protect the patella and lateral structures.  A Z-retractor was place medially to protect the medial structures.  The extra-medullary alignment system was used to make cut the tibial articular surface perpendicular to the anamotic axis of the tibia and in 3 degrees of posterior slope.  The cut surface and alignment jig was removed.  I then used the intramedullary alignment guide to make a 4 valgus cut on the distal femur.  I then marked out the epicondylar axis on the distal femur.  The posterior condylar axis measured 5 degrees.  I then used the anterior referencing sizer and measured the femur to be a size D.  The 4-In-1 cutting block was screwed into place in external rotation matching the posterior condylar angle, making our cuts perpendicular to the epicondylar axis.  Anterior, posterior and chamfer cuts were made with the sagittal saw.  The cutting block and cut pieces were removed.  A lamina spreader was placed in 90 degrees of flexion.  The ACL, PCL, menisci, and posterior condylar osteophytes were removed.  A 10 mm spacer blocked was found to  offer good flexion and extension gap balance after moderate in degree releasing.   The scoop retractor was then placed and the femoral finishing block was pinned in place.  The small sagittal saw was used as well as the lug drill to finish the femur.  The block and cut surfaces were removed and the medullary canal hole filled with autograft bone from the cut pieces.  The tibia was delivered forward in deep flexion and external rotation.  A size 3 tray was  selected and pinned into place centered on the medial 1/3 of the tibial tubercle.  The reamer and keel was used to prepare the tibia through the tray.    I then trialed with the size D femur, size 3 tibia, a 10 mm insert and the 32 patella.  I had excellent flexion/extension gap balance, excellent patella tracking.  Flexion was full and beyond 120 degrees; extension was zero.  These components were chosen and the staff opened them to me on the back table while the knee was lavaged copiously and the cement mixed.  I cemented in the components and removed all excess cement.  The polyethylene tibial component was snapped into place and the knee placed in extension while cement was hardening.  The capsule was infilltrated with 20cc of .25% Marcaine with epinephrine.  A hemovac was place in the joint exiting superolaterally.  A pain pump was place superomedially superficial to the arthrotomy.  Once the cement was hard, the tourniquet was let down.  Hemostasis was obtained.  The arthrotomy was closed with figure-8 #1 vicryl sutures.  The deep soft tissues were closed with #0 vicryls and the subcuticular layer closed with a running #2-0 vicryl.  The skin was reapproximated and closed with skin staples.  The wound was dressed with xeroform, 4 x4's, 2 ABD sponges, a single layer of webril and a TED stocking.   The patient was then awakened, extubated, and taken to the recovery room in stable condition.  BLOOD LOSS:  300cc DRAINS: 1 hemovac, 1 pain catheter COMPLICATIONS:  None.  PLAN OF CARE: Admit to inpatient   PATIENT DISPOSITION:  PACU - hemodynamically stable.   Delay start of Pharmacological VTE agent (>24hrs) due to surgical blood loss or risk of bleeding:  not applicable

## 2011-06-10 NOTE — H&P (Signed)
Catherine Maxwell MRN:  [id number] DOB/SEX:  08-20-1955/female  CHIEF COMPLAINT:  Painful right Knee  HISTORY: Patient is a 55 y.o. female presented with a history of pain in the right knee. Onset of symptoms was gradual starting several years ago with gradually worsening course since that time. The patient noted no past surgery on the right knee. Prior procedures on the knee include arthroscopy. Patient has been treated conservatively with over-the-counter NSAIDs and activity modification. Patient currently rates pain in the knee at 9 out of 10 with activity. There is pain at night.  PAST MEDICAL HISTORY: There are no active problems to display for this patient.  Past Medical History  Diagnosis Date  . Complication of anesthesia     panicky before anesthesia   . Hypertension     stress test- in past recent - [facility]. Rossie   . Recurrent upper respiratory infection (URI)     recent cough, treated /w OTC  . Diabetes mellitus     diag. x2 yrs. ago  . GERD (gastroesophageal reflux disease)     recent reflux, took OTC tx.  . Headache     uses RX- unsure of name  . Arthritis     R knee, back problem    Past Surgical History  Procedure Date  . Joint replacement     2005- L knee, arthroscopic procedure both knees   . Cesarean section     x2  . Tummy     tummy tuck - 1986  . Tubal ligation      MEDICATIONS:   Prescriptions prior to admission  Medication Sig Dispense Refill  . ACETAMINOPHEN-BUTALBITAL (BUPAP) 50-650 MG TABS Take 1 tablet by mouth daily as needed. For pain        . amLODipine-olmesartan (AZOR) 10-40 MG per tablet Take 1 tablet by mouth daily.        Marland Kitchen BIOTIN PO Take 1 tablet by mouth daily.        . calcium carbonate (OS-CAL - DOSED IN MG OF ELEMENTAL CALCIUM) 1250 MG tablet Take 1 tablet by mouth daily.        Marland Kitchen CINNAMON PO Take 1 tablet by mouth daily.        . cyclobenzaprine (FLEXERIL) 10 MG tablet Take 10 mg by mouth 3 (three) times daily as  needed. For muscle spasms       . docusate sodium (COLACE) 100 MG capsule Take 100 mg by mouth daily.        . Ginkgo Biloba (GINKOBA PO) Take 1 tablet by mouth daily.        Marland Kitchen HYDROcodone-acetaminophen (NORCO) 5-325 MG per tablet Take 1 tablet by mouth every 6 (six) hours as needed. For pain       . lisinopril-hydrochlorothiazide (PRINZIDE,ZESTORETIC) 20-25 MG per tablet Take 1 tablet by mouth daily.        Marland Kitchen MAGNESIUM PO Take 1 tablet by mouth daily.        . meclizine (ANTIVERT) 25 MG tablet Take 25 mg by mouth 3 (three) times daily as needed. For dizziness        . metFORMIN (GLUCOPHAGE) 500 MG tablet Take 500 mg by mouth daily with breakfast.        . Multiple Vitamins-Minerals (ZINC PO) Take 1 capsule by mouth daily.        . nebivolol (BYSTOLIC) 10 MG tablet Take 10 mg by mouth daily.        Marland Kitchen omega-3 acid ethyl esters (LOVAZA)  1 G capsule Take 1 g by mouth daily.        . traMADol (ULTRAM) 50 MG tablet Take 50 mg by mouth every 6 (six) hours as needed. For pain         ALLERGIES:   Allergies  Allergen Reactions  . Celebrex (Celecoxib) Swelling    Feet swell  . Morphine And Related Itching    REVIEW OF SYSTEMS:  Pertinent items are noted in HPI.   FAMILY HISTORY:   Family History  Problem Relation Age of Onset  . Anesthesia problems Neg Hx   . Hypotension Neg Hx   . Malignant hyperthermia Neg Hx   . Pseudochol deficiency Neg Hx     SOCIAL HISTORY:   History  Substance Use Topics  . Smoking status: Former Smoker    Quit date: 06/03/1979  . Smokeless tobacco: Not on file  . Alcohol Use: Yes     wine occas     EXAMINATION:  Vital signs in last 24 hours: Temp:  [98.2 F (36.8 C)] 98.2 F (36.8 C) (12/17 0653) Pulse Rate:  [55] 55  (12/17 0653) Resp:  [20] 20  (12/17 0653) BP: (179)/(84) 179/84 mmHg (12/17 0653) SpO2:  [97 %] 97 % (12/17 0653)  General appearance: alert, cooperative and no distress Lungs: clear to auscultation bilaterally Heart: regular  rate and rhythm, S1, S2 normal, no murmur, click, rub or gallop Abdomen: soft, non-tender; bowel sounds normal; no masses,  no organomegaly Extremities: extremities normal, atraumatic, no cyanosis or edema and Homans sign is negative, no sign of DVT Pulses: 2+ and symmetric Skin: Skin color, texture, turgor normal. No rashes or lesions Neurologic: Alert and oriented X 3, normal strength and tone. Normal symmetric reflexes. Normal coordination and gait  Musculoskeletal:  ROM 0-120, Ligaments intact,  Imaging Review Plain radiographs demonstrate severe degenerative joint disease of the right knee. The overall alignment is mild varus. The bone quality appears to be good for age and reported activity level.  Assessment/Plan: End stage arthritis, right knee   The patient history, physical examination and imaging studies are consistent with advanced degenerative joint disease of the right knee. The patient has failed conservative treatment.  The clearance notes were reviewed.  After discussion with the patient it was felt that Total Knee Replacement was indicated. The procedure,  risks, and benefits of total knee arthroplasty were presented and reviewed. The risks including but not limited to aseptic loosening, infection, blood clots, vascular injury, stiffness, patella tracking problems complications among others were discussed. The patient acknowledged the explanation, agreed to proceed with the plan.  Emet Rafanan 06/10/2011, 7:11 AM

## 2011-06-11 ENCOUNTER — Encounter (HOSPITAL_COMMUNITY): Payer: Self-pay | Admitting: Orthopedic Surgery

## 2011-06-11 LAB — BASIC METABOLIC PANEL
BUN: 6 mg/dL (ref 6–23)
BUN: 7 mg/dL (ref 6–23)
CO2: 32 mEq/L (ref 19–32)
Calcium: 8.4 mg/dL (ref 8.4–10.5)
Calcium: 8.9 mg/dL (ref 8.4–10.5)
Creatinine, Ser: 0.64 mg/dL (ref 0.50–1.10)
Creatinine, Ser: 0.68 mg/dL (ref 0.50–1.10)
GFR calc Af Amer: >90 mL/min (ref >90)
GFR calc non Af Amer: >90 mL/min (ref >90)
GFR calc non Af Amer: >90 mL/min (ref >90)
Glucose, Bld: 147 mg/dL — ABNORMAL HIGH (ref 70–99)
Glucose, Bld: 166 mg/dL — ABNORMAL HIGH (ref 70–99)
Potassium: 2.5 mEq/L — CL (ref 3.5–5.1)
Sodium: 135 mEq/L (ref 135–145)

## 2011-06-11 LAB — CBC
HCT: 32.6 % — ABNORMAL LOW (ref 36.0–46.0)
Hemoglobin: 10.7 g/dL — ABNORMAL LOW (ref 12.0–15.0)
MCH: 28.9 pg (ref 26.0–34.0)
MCHC: 32.8 g/dL (ref 30.0–36.0)
MCV: 88.1 fL (ref 78.0–100.0)
RDW: 15.3 % (ref 11.5–15.5)

## 2011-06-11 LAB — GLUCOSE, CAPILLARY: Glucose-Capillary: 132 mg/dL — ABNORMAL HIGH (ref 70–99)

## 2011-06-11 LAB — HEMOGLOBIN A1C: Hgb A1c MFr Bld: 6.7 % — ABNORMAL HIGH (ref <5.7)

## 2011-06-11 MED ORDER — POTASSIUM CHLORIDE CRYS ER 20 MEQ PO TBCR
40.0000 meq | EXTENDED_RELEASE_TABLET | Freq: Two times a day (BID) | ORAL | Status: DC
  Administered 2011-06-11 – 2011-06-12 (×3): 40 meq via ORAL
  Filled 2011-06-11 (×4): qty 2

## 2011-06-11 MED ORDER — POTASSIUM CHLORIDE 10 MEQ/100ML IV SOLN
10.0000 meq | INTRAVENOUS | Status: DC
  Administered 2011-06-11: 10 meq via INTRAVENOUS
  Filled 2011-06-11 (×4): qty 100

## 2011-06-11 NOTE — Progress Notes (Signed)
CRITICAL VALUE ALERT  Critical value received:  Potassium  Date of notification:  06/11/11  Time of notification:  0840  Critical value read back:yes  Nurse who received alert:  Sharyl Nimrod, RN  MD notified (1st page):  Altamese Cabal, PA  Time of first page:  0840  MD notified (2nd page):  Time of second page:  Responding MD:  Altamese Cabal, PA  Time MD responded:  [phone]  Orders received to repeat BMET and give Potassium IV.   Tammy Sours

## 2011-06-11 NOTE — Progress Notes (Signed)
Physical Therapy Evaluation Patient Details Name: Catherine Maxwell MRN: [id number] DOB: 1955/10/27 Today's Date: 06/11/2011  Problem List: There is no problem list on file for this patient.   Past Medical History:  Past Medical History  Diagnosis Date  . Complication of anesthesia     panicky before anesthesia   . Hypertension     stress test- in past recent - [facility]. [place]   . Recurrent upper respiratory infection (URI)     recent cough, treated /w OTC  . Diabetes mellitus     diag. x2 yrs. ago  . GERD (gastroesophageal reflux disease)     recent reflux, took OTC tx.  . Headache     uses RX- unsure of name  . Arthritis     R knee, back problem    Past Surgical History:  Past Surgical History  Procedure Date  . Cesarean section     x2  . Tummy     tummy tuck - 1986  . Tubal ligation   . Joint replacement     2005- L knee, arthroscopic procedure both knees     PT Assessment/Plan/Recommendation PT Assessment Clinical Impression Statement: 55 yo female s/p LTKA presents with decr functional mobility and impairments listed below; will benefit from PT to maximize independence and safety with mobility and enable safe dc home PT Recommendation/Assessment: Patient will need skilled PT in the acute care venue PT Problem List: Decreased strength;Decreased range of motion;Decreased mobility;Decreased knowledge of use of DME;Pain PT Therapy Diagnosis : Difficulty walking;Acute pain PT Plan PT Frequency: 7X/week PT Treatment/Interventions: DME instruction;Gait training;Stair training;Functional mobility training;Therapeutic exercise;Therapeutic activities;Patient/family education PT Recommendation Follow Up Recommendations: Home health PT Equipment Recommended: Rolling walker with 5" wheels PT Goals  Acute Rehab PT Goals PT Goal Formulation: With patient Time For Goal Achievement: 7 days Pt will go Supine/Side to Sit: with modified independence Pt will go Sit to  Supine/Side: with modified independence Pt will go Sit to Stand: with modified independence Pt will go Stand to Sit: with modified independence Pt will Ambulate: >150 feet;with modified independence;with rolling walker Pt will Go Up / Down Stairs: 1-2 stairs;with modified independence;with rolling walker Pt will Perform Home Exercise Program: Independently  PT Evaluation Precautions/Restrictions  Precautions Precautions: Knee Required Braces or Orthoses: Yes Knee Immobilizer: On when out of bed or walking (KI ordered in Nursing orders section) Restrictions Weight Bearing Restrictions: Yes RLE Weight Bearing: Weight bearing as tolerated Prior Functioning  Home Living Lives With: Spouse;Daughter Receives Help From: Family Type of Home: House Home Layout: Two level;Full bath on main level;Able to live on main level with bedroom/bathroom Alternate Level Stairs-Rails:  (Pt does not have to go to second level) Home Access: Stairs to enter Entrance Stairs-Rails: None Entrance Stairs-Number of Steps: 1 Bathroom Shower/Tub: Radio producer Accessibility: Yes How Accessible: Accessible via walker Home Adaptive Equipment: Bedside commode/3-in-1 Additional Comments: Needs RW Prior Function Level of Independence: Independent with basic ADLs;Independent with homemaking with ambulation;Independent with gait;Independent with transfers Able to Take Stairs?: Yes Driving: Yes Leisure: Hobbies-yes (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[employer] Cognition Arousal/Alertness: Awake/alert Overall Cognitive Status: Appears within functional limits for tasks assessed Orientation Level: Oriented X4 Sensation/Coordination Sensation Light Touch: Appears Intact Coordination Gross Motor Movements are Fluid and Coordinated: Yes Fine Motor Movements are Fluid and Coordinated: Yes Extremity Assessment RUE Assessment RUE Assessment: Within  Functional Limits LUE Assessment LUE Assessment: Within Functional Limits RLE Assessment RLE Assessment: Within Functional Limits LLE Assessment  LLE Assessment: Exceptions to [facility] LLE Strength LLE Overall Strength Comments: grossly decr AROM and strentgh, limited by pain; very good quad setting; AAROM flexion to approx 80 degrees Mobility (including Balance) Bed Mobility Bed Mobility: Yes Sit to Supine - Left: 5: Supervision Sit to Supine - Left Details (indicate cue type and reason): cues to sccot hips for optimal position once seated on bed Transfers Transfers: Yes Sit to Stand: 4: Min assist;From chair/3-in-1;With upper extremity assist (without physical contact) Sit to Stand Details (indicate cue type and reason): good job; safe hand placement, safe transfer Stand to Sit: 4: Min assist;To bed (wothout physical contact) Stand to Sit Details: cues for hand placement Ambulation/Gait Ambulation/Gait: Yes Ambulation/Gait Assistance: 5: Supervision Ambulation/Gait Assistance Details (indicate cue type and reason): cues for sequence; cues to self monitor for activity tol; excellent job with amb today Ambulation Distance (Feet): 130 Feet Assistive device: Rolling walker Gait Pattern: Step-through pattern Stairs: Yes Stairs Assistance: 4: Min assist Stairs Assistance Details (indicate cue type and reason): cues for sequence, safety, and optimal positioning Stair Management Technique: No rails;With walker;Backwards;Forwards Number of Stairs: 1  (2 times)    Exercise  Total Joint Exercises Quad Sets: AROM;Left;10 reps Heel Slides: AAROM;10 reps;Left End of Session PT - End of Session Equipment Utilized During Treatment: Left knee immobilizer Activity Tolerance: Patient tolerated treatment well Patient left: in bed;in CPM;with call bell in reach General Behavior During Session: [facility] for tasks performed Cognition: [facility] for tasks performed  Van Clines [facility] [place], [place]  [phone]  06/11/2011, 1:00 PM

## 2011-06-11 NOTE — Progress Notes (Signed)
PATIENT ID:      Catherine Maxwell  MRN:     [id number] DOB/AGE:    September 06, 1955 / 55 y.o.    PROGRESS NOTE Subjective:  negative for Chest Pain  negative for Shortness of Breath  negative for Nausea/Vomiting   negative for Calf Pain  negative for Bowel Movement   Tolerating Diet: yes         Patient reports pain as 4 on 0-10 scale.    Objective: Vital signs in last 24 hours:  Patient Vitals for the past 24 hrs:  BP Temp Temp src Pulse Resp SpO2  06/11/11 0556 113/66 mmHg 98.7 F (37.1 C) Oral 78  18  98 %  06/11/11 0219 144/72 mmHg 98.3 F (36.8 C) Oral 65  18  100 %  06/10/11 2143 139/68 mmHg 97.3 F (36.3 C) Oral 64  16  100 %  06/10/11 1340 137/83 mmHg 98 F (36.7 C) Oral 68  16  97 %  06/10/11 1308 133/73 mmHg - - 68  16  98 %  06/10/11 1300 - 97.6 F (36.4 C) - - - -  06/10/11 1253 145/80 mmHg - - 67  13  99 %  06/10/11 1239 148/66 mmHg - - 68  17  100 %  06/10/11 1223 106/61 mmHg - - 73  23  100 %  06/10/11 1208 146/74 mmHg - - 76  18  100 %  06/10/11 1153 133/104 mmHg - - 75  17  100 %  06/10/11 1137 158/78 mmHg - - 83  12  95 %  06/10/11 1134 - 97.8 F (36.6 C) - - - 100 %      Intake/Output from previous day:   12/17 0701 - 12/18 0700 In: 3505 [P.O.:480; I.V.:3025] Out: 1750 [Urine:1275; Drains:425]   Intake/Output this shift:       Intake/Output      12/17 0701 - 12/18 0700 12/18 0701 - 12/19 0700   P.O. 480    I.V. 3025    Total Intake 3505    Urine 1275    Drains 425    Blood 50    Total Output 1750    Net +1755            LABORATORY DATA:  Basename 06/11/11 0630  WBC 9.6  HGB 10.7*  HCT 32.6*  PLT 183   No results found for this basename: NA:7,K:7,CL:7,CO2:7,BUN:7,CREATININE:7,GLUCOSE:7,CALCIUM:7 in the last 168 hours Lab Results  Component Value Date   INR 0.84 06/03/2011    Examination:  General appearance: alert, cooperative and no distress Extremities: Homans sign is negative, no sign of DVT  Wound Exam: clean, dry, intact    Drainage:  Scant/small amount Serosanguinous exudate  Motor Exam EHL and FHL Intact  Sensory Exam Deep Peroneal normal  Assessment:    1 Day Post-Op  Procedure(s) (LRB): TOTAL KNEE ARTHROPLASTY (Right)  ADDITIONAL DIAGNOSIS:  Active Problems:  * No active hospital problems. *   Acute Blood Loss Anemia   Plan: Physical Therapy as ordered Weight Bearing as Tolerated (WBAT)  DVT Prophylaxis:  Lovenox  DISCHARGE PLAN: Home  DISCHARGE NEEDS: HHPT, CPM, Walker and 3-in-1 comode seat         Kaleeya Hancock 06/11/2011, 8:30 AM

## 2011-06-11 NOTE — Progress Notes (Signed)
CARE MANAGEMENT NOTE 06/11/2011 Discharge planning. Patient preoperatively setup with [facility], no changes. Has DME., has family support.

## 2011-06-11 NOTE — Progress Notes (Signed)
Occupational Therapy Evaluation Patient Details Name: Catherine Maxwell MRN: [id number] DOB: 1955-07-03 Today's Date: 06/11/2011  Problem List: There is no problem list on file for this patient.   Past Medical History:  Past Medical History  Diagnosis Date  . Complication of anesthesia     panicky before anesthesia   . Hypertension     stress test- in past recent - [facility]. [place]   . Recurrent upper respiratory infection (URI)     recent cough, treated /w OTC  . Diabetes mellitus     diag. x2 yrs. ago  . GERD (gastroesophageal reflux disease)     recent reflux, took OTC tx.  . Headache     uses RX- unsure of name  . Arthritis     R knee, back problem    Past Surgical History:  Past Surgical History  Procedure Date  . Cesarean section     x2  . Tummy     tummy tuck - 1986  . Tubal ligation   . Joint replacement     2005- L knee, arthroscopic procedure both knees     OT Assessment/Plan/Recommendation OT Assessment Clinical Impression Statement: Pt s/p R TKA.  Pt is doing very well and is motivated to participate with therapy.  Pt will benefit from acute OT to increase I with ADLs and functional transfers in prep for d/c home with husband and daughter. OT Recommendation/Assessment: Patient will need skilled OT in the acute care venue OT Problem List: Decreased knowledge of use of DME or AE;Pain OT Therapy Diagnosis : Acute pain OT Plan OT Frequency: Min 2X/week OT Treatment/Interventions: Self-care/ADL training;DME and/or AE instruction;Therapeutic activities;Patient/family education OT Recommendation Follow Up Recommendations: 24 hour supervision/assistance Equipment Recommended: None recommended by OT Individuals Consulted Consulted and Agree with Results and Recommendations: Patient OT Goals Acute Rehab OT Goals OT Goal Formulation: With patient Time For Goal Achievement: 7 days ADL Goals Pt Will Perform Grooming: with modified independence;Standing  at sink ADL Goal: Grooming - Progress: Not met Pt Will Perform Lower Body Bathing: with modified independence;Sit to stand from bed;Sit to stand from chair ADL Goal: Lower Body Bathing - Progress: Not met Pt Will Perform Lower Body Dressing: with modified independence;Sit to stand from chair;Sit to stand from bed ADL Goal: Lower Body Dressing - Progress: Not met Pt Will Transfer to Toilet: with modified independence;Ambulation;with DME;3-in-1 ADL Goal: Toilet Transfer - Progress: Not met Pt Will Perform Toileting - Clothing Manipulation: with modified independence;Sitting on 3-in-1 or toilet;Standing ADL Goal: Toileting - Clothing Manipulation - Progress: Not met Pt Will Perform Tub/Shower Transfer: with modified independence;Ambulation;Tub transfer;with DME ADL Goal: Tub/Shower Transfer - Progress: Not met  OT Evaluation Precautions/Restrictions  Restrictions Weight Bearing Restrictions: Yes RLE Weight Bearing: Weight bearing as tolerated Prior Functioning Home Living Lives With: Spouse;Daughter Receives Help From: Family (35 y.o. daughter) Type of Home: House Home Layout: Two level;Full bath on main level;Able to live on main level with bedroom/bathroom Alternate Level Stairs-Rails:  (Pt does not have to go to second level) Home Access: Stairs to enter Entrance Stairs-Rails: Right Entrance Stairs-Number of Steps: 1 Bathroom Shower/Tub: Forensic scientist: Standard Bathroom Accessibility: Yes How Accessible: Accessible via walker Home Adaptive Equipment: Bedside commode/3-in-1 Prior Function Level of Independence: Independent with basic ADLs;Independent with homemaking with ambulation;Independent with gait;Independent with transfers Able to Take Stairs?: Yes Driving: Yes Leisure: Hobbies-yes (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  ADL ADL Grooming: Performed;Wash/dry hands;Supervision/safety Where Assessed - Grooming: Standing at  [employer]  Transfer: Performed;Minimal Dentist Details (indicate cue type and reason): VC for sequencing and hand placement Toilet Transfer Method: Ambulating Toilet Transfer Equipment: Raised toilet seat with arms (or 3-in-1 over toilet) Toileting - Clothing Manipulation: Performed;Supervision/safety Where Assessed - Toileting Clothing Manipulation: Standing Toileting - Hygiene: Performed;Independent Where Assessed - Toileting Hygiene: Sit on 3-in-1 or toilet Equipment Used: Rolling walker Vision/Perception    Cognition Cognition Arousal/Alertness: Awake/alert Overall Cognitive Status: Appears within functional limits for tasks assessed Orientation Level: Oriented X4 Sensation/Coordination   Extremity Assessment RUE Assessment RUE Assessment: Within Functional Limits LUE Assessment LUE Assessment: Within Functional Limits Mobility  Bed Mobility Bed Mobility: No Transfers Transfers: Yes Sit to Stand: 4: Min assist;With upper extremity assist;With armrests;From chair/3-in-1 Sit to Stand Details (indicate cue type and reason): VC for sequencing and hand placement Stand to Sit: 4: Min assist;With upper extremity assist;To chair/3-in-1;With armrests Stand to Sit Details: VC for sequencing and hand placement Exercises   End of Session OT - End of Session Equipment Utilized During Treatment: Gait belt;Right knee immobilizer Activity Tolerance: Patient tolerated treatment well Patient left: in chair;with call bell in reach General Behavior During Session: [facility] for tasks performed Cognition: [facility] for tasks performed   Cipriano Mile 06/11/2011, 10:44 AM  06/11/2011 Cipriano Mile OTR/L Pager [phone] Office [phone]

## 2011-06-12 LAB — CBC
HCT: 33 % — ABNORMAL LOW (ref 36.0–46.0)
Hemoglobin: 11 g/dL — ABNORMAL LOW (ref 12.0–15.0)
MCHC: 33.3 g/dL (ref 30.0–36.0)
MCV: 87.5 fL (ref 78.0–100.0)
RDW: 15 % (ref 11.5–15.5)

## 2011-06-12 LAB — GLUCOSE, CAPILLARY: Glucose-Capillary: 123 mg/dL — ABNORMAL HIGH (ref 70–99)

## 2011-06-12 MED ORDER — ENOXAPARIN SODIUM 40 MG/0.4ML ~~LOC~~ SOLN: 40.0000 mg | SUBCUTANEOUS | Status: DC

## 2011-06-12 MED ORDER — OXYCODONE HCL 10 MG PO TB12: 10.0000 mg | ORAL_TABLET | Freq: Two times a day (BID) | ORAL | Status: AC

## 2011-06-12 MED ORDER — OXYCODONE HCL 5 MG PO TABS: 5.0000 mg | ORAL_TABLET | ORAL | Status: AC | PRN

## 2011-06-12 MED ORDER — METHOCARBAMOL 500 MG PO TABS: 500.0000 mg | ORAL_TABLET | Freq: Four times a day (QID) | ORAL | Status: AC | PRN

## 2011-06-12 NOTE — Discharge Summary (Signed)
PATIENT ID:      Catherine Maxwell  MRN:     [id number] DOB/AGE:    1955-08-30 / 55 y.o.     DISCHARGE SUMMARY  ADMISSION DATE:    06/10/2011 DISCHARGE DATE:   06/12/2011   ADMISSION DIAGNOSIS: OA RIGHT KNEE  (OA RIGHT KNEE)  DISCHARGE DIAGNOSIS:  OA RIGHT KNEE    ADDITIONAL DIAGNOSIS: Active Problems:  * No active hospital problems. *   Past Medical History  Diagnosis Date  . Complication of anesthesia     panicky before anesthesia   . Hypertension     stress test- in past recent - [facility]. [place]   . Recurrent upper respiratory infection (URI)     recent cough, treated /w OTC  . Diabetes mellitus     diag. x2 yrs. ago  . GERD (gastroesophageal reflux disease)     recent reflux, took OTC tx.  . Headache     uses RX- unsure of name  . Arthritis     R knee, back problem     PROCEDURE: Procedure(s): TOTAL KNEE ARTHROPLASTY on 06/10/2011  CONSULTS:     HISTORY:  See H&P in chart  HOSPITAL COURSE:  Catherine Maxwell is a 55 y.o. admitted on 06/10/2011 and found to have a diagnosis of OA RIGHT KNEE.  After appropriate laboratory studies were obtained  they were taken to the operating room on 06/10/2011 and underwent Procedure(s): TOTAL KNEE ARTHROPLASTY.   They were given perioperative antibiotics:  Anti-infectives     Start     Dose/Rate Route Frequency Ordered Stop   06/10/11 1500   ceFAZolin (ANCEF) IVPB 2 g/50 mL premix        2 g 100 mL/hr over 30 Minutes Intravenous Every 6 hours 06/10/11 1343 06/13/11 0254   06/09/11 0900   ceFAZolin (ANCEF) IVPB 2 g/50 mL premix        2 g 100 mL/hr over 30 Minutes Intravenous 60 min pre-op 06/09/11 0858 06/10/11 0858        . Blood products given:none   The remainder of the hospital course was dedicated to ambulation and strengthening.   The patient was discharged on 2 Days Post-Op in  Good condition.   DIAGNOSTIC STUDIES: Recent vital signs: Patient Vitals for the past 24 hrs:  BP Temp Temp src Pulse Resp  SpO2 Height Weight  06/12/11 0701 133/68 mmHg 99.1 F (37.3 C) Oral 81  18  96 % - -  2011/06/13 2100 152/73 mmHg 99.2 F (37.3 C) Oral 75  18  95 % - -  06/13/11 2000 - - - - - - 5\' 4"  (1.626 m) 99.6 kg (219 lb 9.3 oz)  2011/06/13 1330 188/75 mmHg 98.4 F (36.9 C) - 70  18  100 % - -       Recent laboratory studies:  Basename 06/12/11 0522 06/13/2011 0630  WBC 13.1* 9.6  HGB 11.0* 10.7*  HCT 33.0* 32.6*  PLT 217 183    Basename 06-13-11 0924 06/13/11 0630  NA 135 138  K 2.9* 2.5*  CL 97 100  CO2 32 29  BUN 7 6  CREATININE 0.68 0.64  GLUCOSE 166* 147*  CALCIUM 8.9 8.4   Lab Results  Component Value Date   INR 0.84 06/03/2011     Recent Radiographic Studies :  Dg Chest 2 View  06/03/2011  *RADIOLOGY REPORT*  Clinical Data: Preoperative assessment for right knee arthroplasty, history hypertension, diabetes  CHEST - 2 VIEW  Comparison:  10/12/2003  Findings: Enlargement of cardiac silhouette. Mediastinal contours and pulmonary vascularity normal. Bronchitic changes without infiltrate or pleural effusion. No pneumothorax. Questionable vague nodular density 7 mm diameter right upper lobe, new; cannot exclude pulmonary nodule. No acute osseous findings.  IMPRESSION: Enlargement of cardiac silhouette. Bronchitic changes with question 7 mm diameter right upper lobe nodular density; recommend CT chest to exclude pulmonary nodule.  These results will be called to the ordering clinician or representative by the Radiologist Assistant, and communication documented in the PACS Dashboard.  Original Report Authenticated By: Lollie Marrow, M.D.    DISCHARGE INSTRUCTIONS:   DISCHARGE MEDICATIONS:  Current Discharge Medication List    CONTINUE these medications which have NOT CHANGED   Details  ACETAMINOPHEN-BUTALBITAL (BUPAP) 50-650 MG TABS Take 1 tablet by mouth daily as needed. For pain      amLODipine-olmesartan (AZOR) 10-40 MG per tablet Take 1 tablet by mouth daily.      BIOTIN PO Take  1 tablet by mouth daily.      calcium carbonate (OS-CAL - DOSED IN MG OF ELEMENTAL CALCIUM) 1250 MG tablet Take 1 tablet by mouth daily.      CINNAMON PO Take 1 tablet by mouth daily.      cyclobenzaprine (FLEXERIL) 10 MG tablet Take 10 mg by mouth 3 (three) times daily as needed. For muscle spasms     docusate sodium (COLACE) 100 MG capsule Take 100 mg by mouth daily.      Ginkgo Biloba (GINKOBA PO) Take 1 tablet by mouth daily.      HYDROcodone-acetaminophen (NORCO) 5-325 MG per tablet Take 1 tablet by mouth every 6 (six) hours as needed. For pain     lisinopril-hydrochlorothiazide (PRINZIDE,ZESTORETIC) 20-25 MG per tablet Take 1 tablet by mouth daily.      MAGNESIUM PO Take 1 tablet by mouth daily.      meclizine (ANTIVERT) 25 MG tablet Take 25 mg by mouth 3 (three) times daily as needed. For dizziness      metFORMIN (GLUCOPHAGE) 500 MG tablet Take 500 mg by mouth daily with breakfast.      Multiple Vitamins-Minerals (ZINC PO) Take 1 capsule by mouth daily.      nebivolol (BYSTOLIC) 10 MG tablet Take 10 mg by mouth daily.      traMADol (ULTRAM) 50 MG tablet Take 50 mg by mouth every 6 (six) hours as needed. For pain     omega-3 acid ethyl esters (LOVAZA) 1 G capsule Take 1 g by mouth daily.          FOLLOW UP VISIT:   Follow-up Information    Follow up with Raymon Mutton, MD. Call on 06/27/2011.   Contact information:   [address] [place] [address] [phone]          DISPOSITION:  Home    CONDITION:  Good   [place] 06/12/2011, 7:13 AM

## 2011-06-12 NOTE — Progress Notes (Signed)
Occupational Therapy Treatment Patient Details Name: Catherine Maxwell MRN: [id number] DOB: [birth year] Today's Date: 06/12/2011  OT Assessment/Plan OT Assessment/Plan Comments on Treatment Session: Pt progressing toward goals. OT Plan: Discharge plan remains appropriate OT Frequency: Min 2X/week Follow Up Recommendations: 24 hour supervision/assistance Equipment Recommended: None recommended by OT OT Goals ADL Goals Pt Will Perform Grooming: with modified independence;Standing at sink ADL Goal: Grooming - Progress: Progressing toward goals Pt Will Transfer to Toilet: with modified independence;Ambulation;with DME;3-in-1 ADL Goal: Toilet Transfer - Progress: Progressing toward goals  OT Treatment Precautions/Restrictions  Precautions Precautions: Knee Required Braces or Orthoses: Yes Knee Immobilizer: On when out of bed or walking Restrictions Weight Bearing Restrictions: Yes RLE Weight Bearing: Weight bearing as tolerated   ADL ADL Grooming: Performed;Teeth care;Supervision/safety Where Assessed - Grooming: Standing at sink Upper Body Dressing: Performed;Set up Where Assessed - Upper Body Dressing: Standing Toilet Transfer: Simulated;Supervision/safety Toilet Transfer Method: Ambulating Equipment Used: Rolling walker Mobility  Bed Mobility Bed Mobility: Yes Supine to Sit: 6: Modified independent (Device/Increase time) Sitting - Scoot to Edge of Bed: 6: Modified independent (Device/Increase time) Transfers Transfers: Yes Sit to Stand: 5: Supervision;From bed;With upper extremity assist Stand to Sit: 5: Supervision;To chair/3-in-1;With armrests;With upper extremity assist Exercises    End of Session OT - End of Session Equipment Utilized During Treatment: Right knee immobilizer Activity Tolerance: Patient tolerated treatment well Patient left: in chair;with call bell in reach General Behavior During Session: [facility] for tasks performed Cognition: [facility] for tasks  performed  Cipriano Mile  06/12/2011, 1:50 PM 06/12/2011 Cipriano Mile OTR/L Pager [phone] Office [phone]

## 2011-06-12 NOTE — Progress Notes (Signed)
PATIENT ID:      Catherine Maxwell  MRN:     [id number] DOB/AGE:    10-10-1955 / 55 y.o.    PROGRESS NOTE Subjective:  negative for Chest Pain  negative for Shortness of Breath  negative for Nausea/Vomiting   negative for Calf Pain  negative for Bowel Movement   Tolerating Diet: yes         Patient reports pain as 2 on 0-10 scale.    Objective: Vital signs in last 24 hours:  Patient Vitals for the past 24 hrs:  BP Temp Temp src Pulse Resp SpO2 Height Weight  06/12/11 0701 133/68 mmHg 99.1 F (37.3 C) Oral 81  18  96 % - -  06/11/11 2100 152/73 mmHg 99.2 F (37.3 C) Oral 75  18  95 % - -  06/11/11 2000 - - - - - - 5\' 4"  (1.626 m) 99.6 kg (219 lb 9.3 oz)  06/11/11 1330 188/75 mmHg 98.4 F (36.9 C) - 70  18  100 % - -      Intake/Output from previous day:   12/18 0701 - 12/19 0700 In: 480 [P.O.:480] Out: -    Intake/Output this shift:       Intake/Output      12/18 0701 - 12/19 0700 12/19 0701 - 12/20 0700   P.O. 480    I.V. (mL/kg)     Total Intake(mL/kg) 480 (4.8)    Urine (mL/kg/hr)     Drains     Blood     Total Output     Net +480         Urine Occurrence 8 x       LABORATORY DATA:  Basename 06/12/11 0522 06/11/11 0630  WBC 13.1* 9.6  HGB 11.0* 10.7*  HCT 33.0* 32.6*  PLT 217 183    Basename 06/11/11 0924 06/11/11 0630  NA 135 138  K 2.9* 2.5*  CL 97 100  CO2 32 29  BUN 7 6  CREATININE 0.68 0.64  GLUCOSE 166* 147*  CALCIUM 8.9 8.4   Lab Results  Component Value Date   INR 0.84 06/03/2011    Examination:  General appearance: alert, cooperative and no distress Extremities: Homans sign is negative, no sign of DVT  Wound Exam: clean, dry, intact   Drainage:  None: wound tissue dry  Motor Exam EHL and FHL Intact  Sensory Exam Deep Peroneal normal  Assessment:    2 Days Post-Op  Procedure(s) (LRB): TOTAL KNEE ARTHROPLASTY (Right)  ADDITIONAL DIAGNOSIS:  Active Problems:  * No active hospital problems. *   Acute Blood Loss  Anemia   Plan: Physical Therapy as ordered Weight Bearing as Tolerated (WBAT)  DVT Prophylaxis:  Lovenox  DISCHARGE PLAN: Home  DISCHARGE NEEDS: HHPT, CPM, Walker and 3-in-1 comode seat         Nitya Cauthon 06/12/2011, 7:10 AM

## 2013-08-12 IMAGING — CR DG CHEST 2V
2 series · 2 of 2 positions shown · non-contrast
Comparison: 10/12/2003

CLINICAL DATA: Preoperative assessment for right knee arthroplasty,
history hypertension, diabetes

CHEST - 2 VIEW

[view not recorded (1 of 2)]
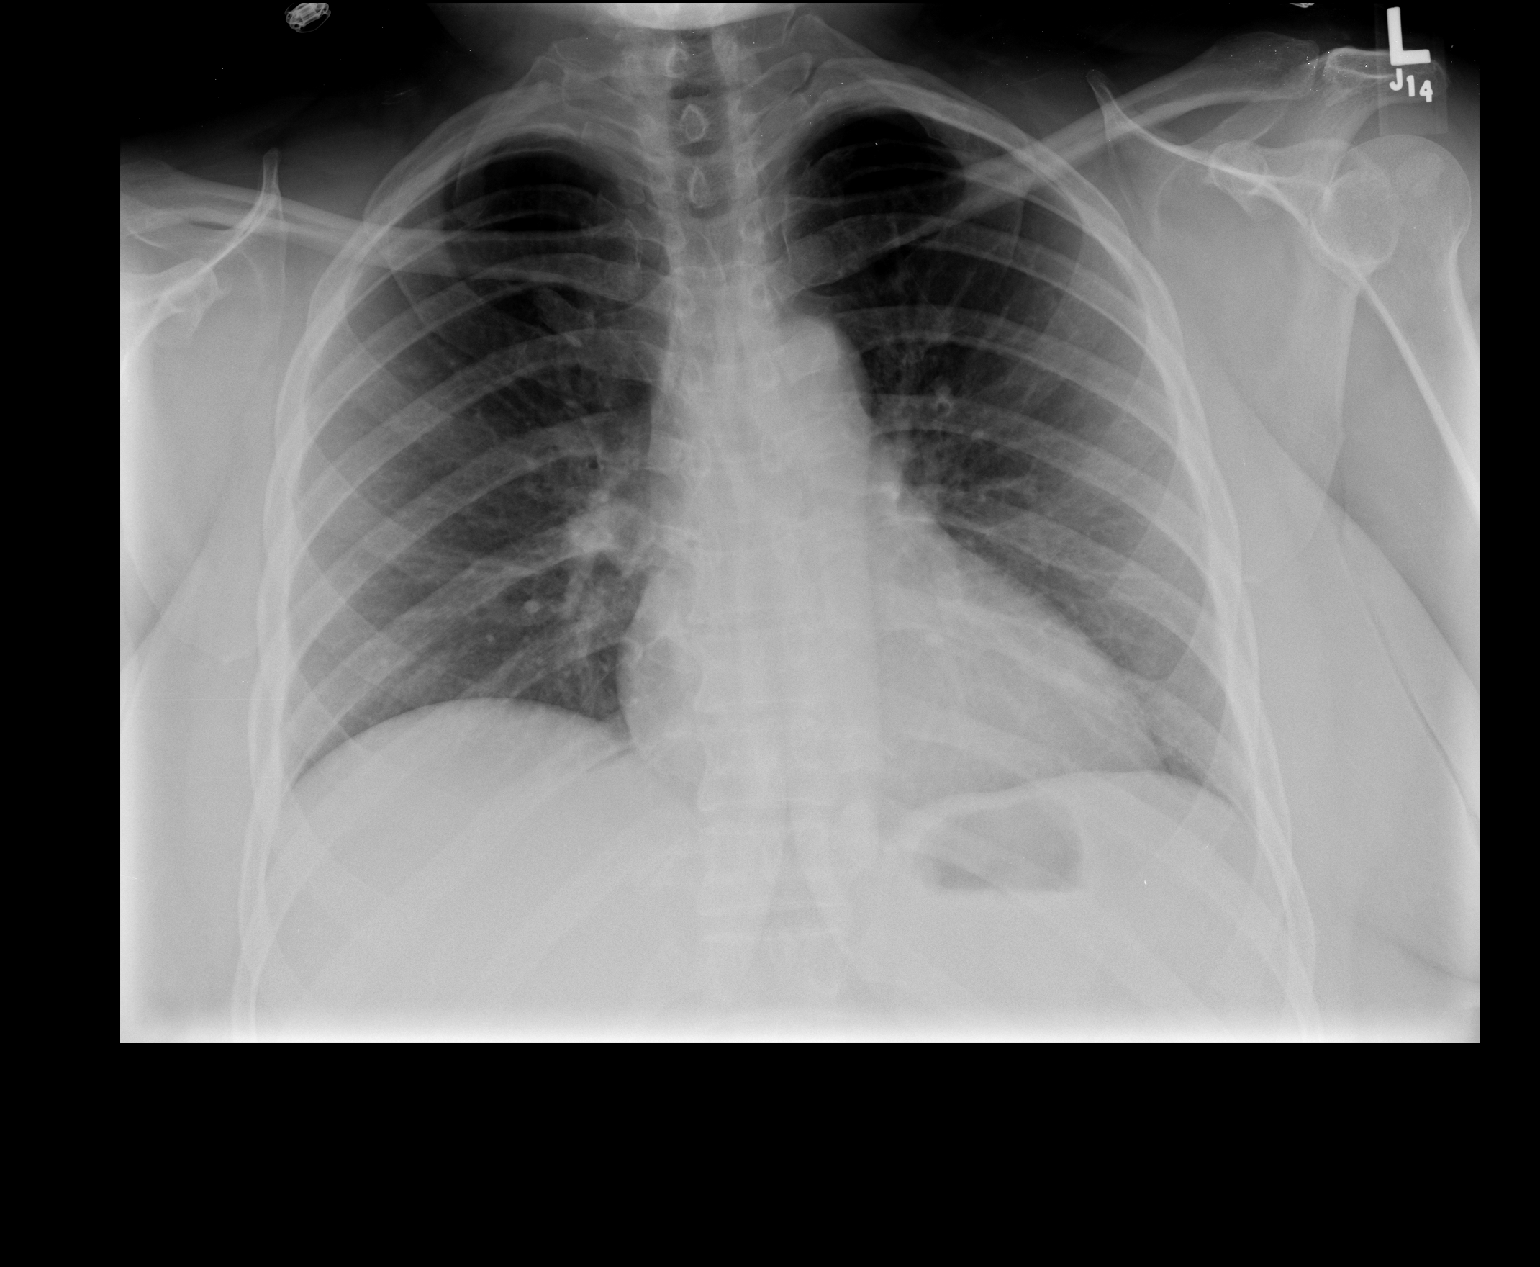

[view not recorded (2 of 2)]
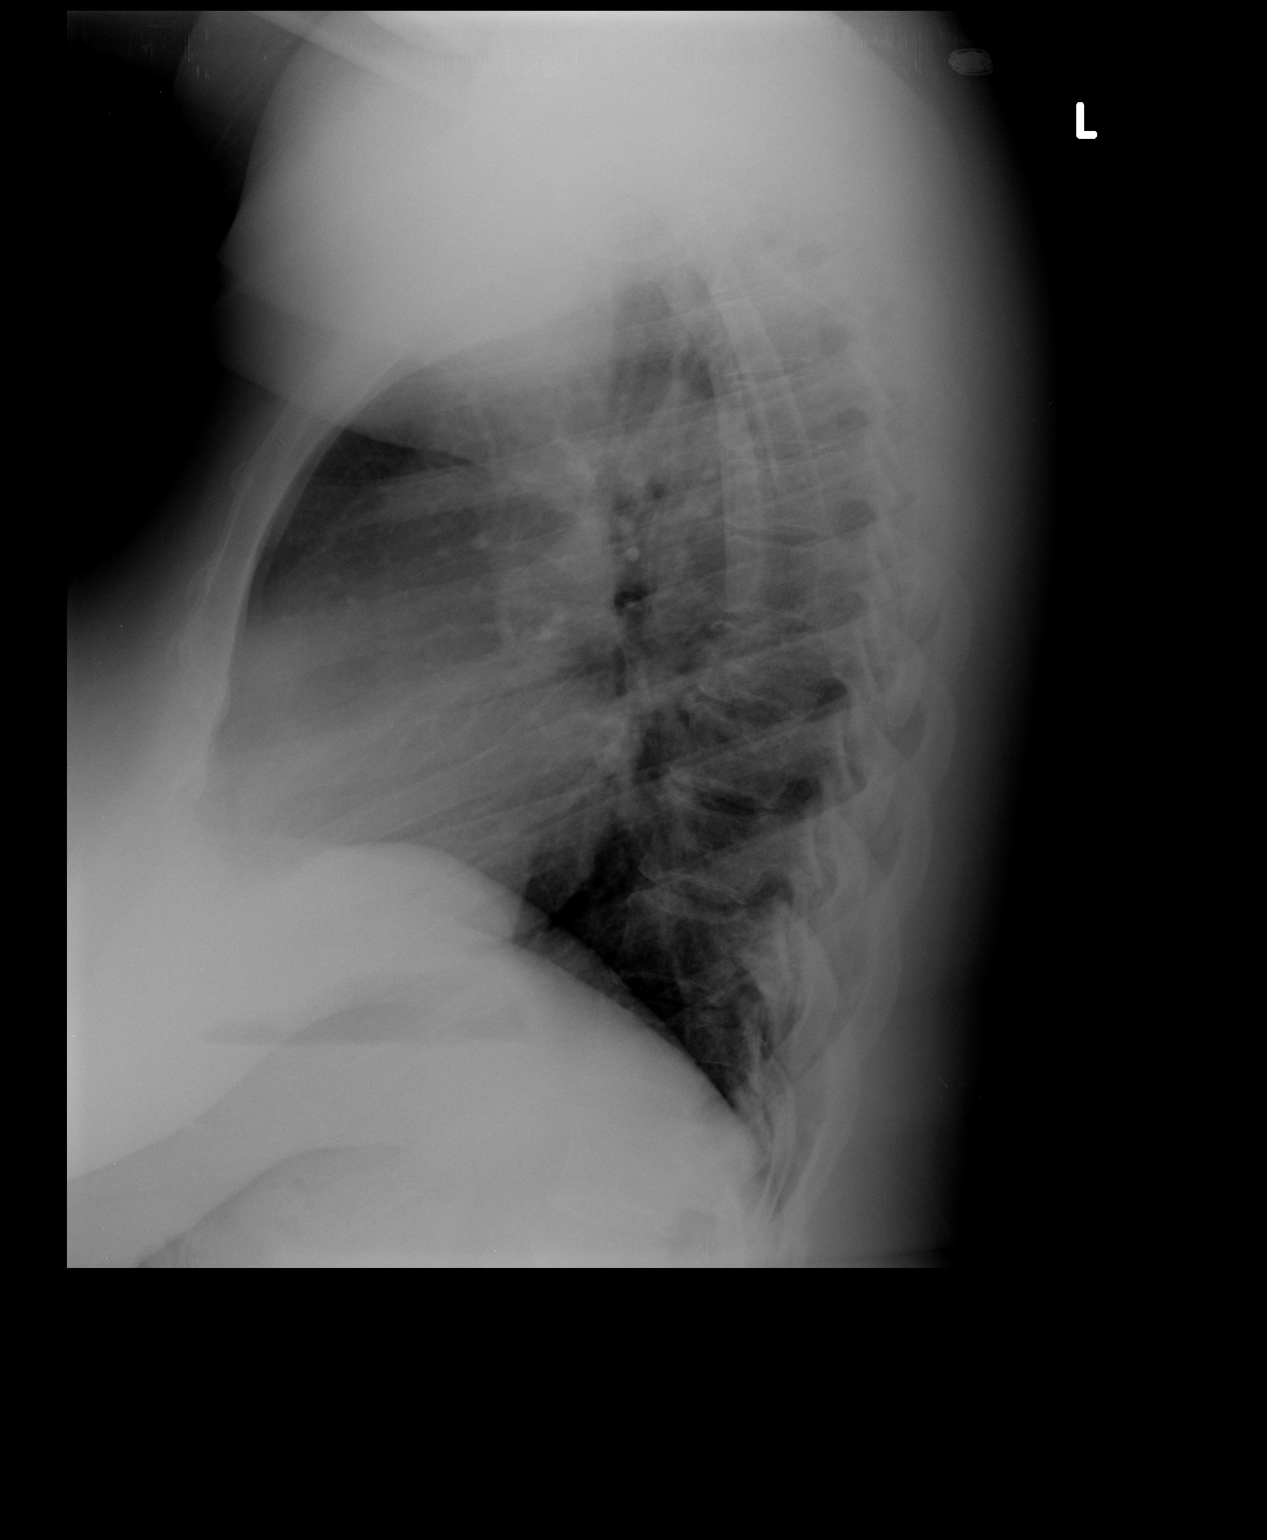

[2 of 2 positions shown; findings below may reference images not displayed]

FINDINGS: Enlargement of cardiac silhouette.
Mediastinal contours and pulmonary vascularity normal.
Bronchitic changes without infiltrate or pleural effusion.
No pneumothorax.
Questionable vague nodular density 7 mm diameter right upper lobe,
new; cannot exclude pulmonary nodule.
No acute osseous findings.
IMPRESSION: Enlargement of cardiac silhouette.
Bronchitic changes with question 7 mm diameter right upper lobe
nodular density; recommend CT chest to exclude pulmonary nodule.

These results will be called to the ordering clinician or
representative by the Radiologist Assistant, and communication
documented in the PACS Dashboard.

## 2013-10-26 ENCOUNTER — Encounter: Payer: Self-pay | Admitting: Cardiology

## 2013-10-26 DIAGNOSIS — Z9289 Personal history of other medical treatment: Secondary | ICD-10-CM | POA: Insufficient documentation

## 2013-10-26 DIAGNOSIS — R600 Localized edema: Secondary | ICD-10-CM

## 2013-10-26 DIAGNOSIS — E119 Type 2 diabetes mellitus without complications: Secondary | ICD-10-CM

## 2013-10-26 DIAGNOSIS — K219 Gastro-esophageal reflux disease without esophagitis: Secondary | ICD-10-CM | POA: Insufficient documentation

## 2013-10-26 DIAGNOSIS — I10 Essential (primary) hypertension: Secondary | ICD-10-CM | POA: Insufficient documentation

## 2013-10-26 DIAGNOSIS — T8859XA Other complications of anesthesia, initial encounter: Secondary | ICD-10-CM | POA: Insufficient documentation

## 2013-10-26 DIAGNOSIS — J069 Acute upper respiratory infection, unspecified: Secondary | ICD-10-CM | POA: Insufficient documentation

## 2013-10-26 DIAGNOSIS — T4145XA Adverse effect of unspecified anesthetic, initial encounter: Secondary | ICD-10-CM | POA: Insufficient documentation

## 2013-10-26 DIAGNOSIS — M1990 Unspecified osteoarthritis, unspecified site: Secondary | ICD-10-CM | POA: Insufficient documentation

## 2013-10-26 DIAGNOSIS — R519 Headache, unspecified: Secondary | ICD-10-CM | POA: Insufficient documentation

## 2013-10-26 DIAGNOSIS — R51 Headache: Secondary | ICD-10-CM | POA: Insufficient documentation

## 2013-10-26 HISTORY — DX: Localized edema: R60.0

## 2013-10-26 HISTORY — DX: Type 2 diabetes mellitus without complications: E11.9

## 2013-10-26 HISTORY — DX: Personal history of other medical treatment: Z92.89

## 2013-10-27 ENCOUNTER — Encounter: Payer: Self-pay | Admitting: Cardiology

## 2013-10-27 ENCOUNTER — Ambulatory Visit (INDEPENDENT_AMBULATORY_CARE_PROVIDER_SITE_OTHER): Payer: PRIVATE HEALTH INSURANCE | Admitting: Cardiology

## 2013-10-27 VITALS — BP 142/82 | HR 47 | Ht 64.0 in | Wt 212.0 lb

## 2013-10-27 DIAGNOSIS — R9431 Abnormal electrocardiogram [ECG] [EKG]: Secondary | ICD-10-CM

## 2013-10-27 DIAGNOSIS — R001 Bradycardia, unspecified: Secondary | ICD-10-CM

## 2013-10-27 DIAGNOSIS — I701 Atherosclerosis of renal artery: Secondary | ICD-10-CM

## 2013-10-27 DIAGNOSIS — R609 Edema, unspecified: Secondary | ICD-10-CM

## 2013-10-27 DIAGNOSIS — I498 Other specified cardiac arrhythmias: Secondary | ICD-10-CM

## 2013-10-27 DIAGNOSIS — I10 Essential (primary) hypertension: Secondary | ICD-10-CM

## 2013-10-27 DIAGNOSIS — R600 Localized edema: Secondary | ICD-10-CM

## 2013-10-27 HISTORY — DX: Bradycardia, unspecified: R00.1

## 2013-10-27 MED ORDER — MELOXICAM 15 MG PO TABS: ORAL_TABLET | ORAL | Status: DC

## 2013-10-27 MED ORDER — LOSARTAN POTASSIUM 100 MG PO TABS: 100.0000 mg | ORAL_TABLET | Freq: Every day | ORAL | Status: DC

## 2013-10-27 MED ORDER — HYDROCHLOROTHIAZIDE 25 MG PO TABS: 25.0000 mg | ORAL_TABLET | Freq: Every day | ORAL | Status: DC

## 2013-10-27 MED ORDER — AMLODIPINE BESYLATE 10 MG PO TABS
10.0000 mg | ORAL_TABLET | Freq: Every day | ORAL | Status: DC
Start: 2013-10-27 — End: 2013-12-08

## 2013-10-27 NOTE — Assessment & Plan Note (Signed)
She has resting sinus bradycardia. It is possible she may have some fatigue from this. Her carvedilol dose will be reduced back to 6.25 twice a day. Clonidine may also be playing a role with her bradycardia.  As part of today's evaluation today I spent greater than 45 minutes with her total care. More than half of this was spent with a direct extensive discussion with the patient and her husband about all aspects of her care.

## 2013-10-27 NOTE — Assessment & Plan Note (Signed)
It appears that the patient has blood pressure that is difficult to control. She says that was under better control for a period of time. Today it is controlled. It was elevated when she was in the primary care office recently. She is on medications including olmasartan , amlodipine, hydrochlorothiazide, and clonidine and carvedilol. Most recently her carvedilol was increased from 6.25-12.5 mg twice a day. Today she has resting bradycardia. Because she has had a change in her blood pressure, we will proceed with a renal artery Doppler to rule out renal artery stenosis. Fortunately her renal function is normal by the recent labs that were sent. She also needs two-dimensional echo to assess LV function and thickness. I have encouraged her to try to decrease the dose of meloxicam as it could play a role with her blood pressure. I've asked her check her blood pressure daily or every other day sitting in a similar position at home. I've asked her to record these pressures. We talked about moderation in the fluid and salt intake. She is on a combination medicine that I am changing part of. We will switch her to gener[employer]. olmasartan will be changed to losartan 100 mg. Amlodipine will be increased to 10 mg daily. Hydrochlorothiazide will be continued. Her carvedilol dose will be lowered back to 6.25 twice a day.

## 2013-10-27 NOTE — Assessment & Plan Note (Signed)
At this point she has no significant edema. We will follow her volume status.

## 2013-10-27 NOTE — Patient Instructions (Addendum)
**Note De-Identified Deaaron Fulghum Obfuscation** Your physician has requested that you have an echocardiogram. Echocardiography is a painless test that uses sound waves to create images of your heart. It provides your doctor with information about the size and shape of your heart and how well your heart's chambers and valves are working. This procedure takes approximately one hour. There are no restrictions for this procedure.  Your physician has requested that you have a renal artery duplex. During this test, an ultrasound is used to evaluate blood flow to the kidneys. Allow one hour for this exam. Do not eat after midnight the day before and avoid carbonated beverages. Take your medications as you usually do.  Your physician has recommended you make the following change in your medication:  stop taking Tribenzor and start taking Losartan 100 mg daily, Amlodipine 10 mg daily and HCTZ 25 mg daily. Decrease your dose of Meloxicam 15 mg to 1 tablet every other day.   Your physician recommends that you schedule a follow-up appointment in: 4 to 6 weeks  Your physician has requested that you regularly monitor and record your blood pressure readings at home. Please use the same machine at the same time of day to check your readings and record them to bring to your follow-up visit. Please check your blood pressure about 1 to 2 hours after taking your medications.  Please monitor your salt and fluid intake.

## 2013-10-27 NOTE — Progress Notes (Signed)
Patient ID: Catherine Maxwell, female   DOB: 06-05-1956, 58 y.o.   MRN: [id number]    HPI  The patient is referred for cardiology consultation as a new patient. She is referred to help with the evaluation of hypertension and edema. She has been receiving very nice care from Dr. Unk Lightning her primary physician. Most recently she still had an increase in blood pressure. Decision was made to refer for further evaluation. As part of today's evaluation I have reviewed a very complete records sent from the office of Dr. Unk Lightning.  The patient has had high blood pressure for many years. She says that she thinks it was under better control for a period of time. Then it became worse. She does take her pressure at home. However this is infrequent. She also has noted some mild edema. She's not having any significant shortness of breath. She does have a brief twinges of chest discomfort.  Historically the patient had a normal nuclear stress test in 2013 with a normal ejection fraction and no ischemia.  Allergies  Allergen Reactions  . Celebrex [Celecoxib] Swelling    Feet swell  . Morphine And Related Itching    Current Outpatient Prescriptions  Medication Sig Dispense Refill  . Acetaminophen (TYLENOL PO) Take by mouth as needed.      Marland Kitchen atorvastatin (LIPITOR) 10 MG tablet Take 10 mg by mouth daily.      Marland Kitchen CALCIUM PO Take by mouth daily.      . carvedilol (COREG) 6.25 MG tablet Take 12.5 mg by mouth 2 (two) times daily with a meal.      . CINNAMON PO Take 1 tablet by mouth daily.        . cloNIDine (CATAPRES) 0.1 MG tablet Take 0.1 mg by mouth 2 (two) times daily.      . Cyanocobalamin (VITAMIN B-12 PO) Take by mouth daily.      Marland Kitchen docusate sodium (COLACE) 100 MG capsule Take 100 mg by mouth daily.        Marland Kitchen guaiFENesin-codeine (ROBITUSSIN AC) 100-10 MG/5ML syrup Take 5 mLs by mouth 3 (three) times daily as needed for cough.      Marland Kitchen MAGNESIUM PO Take 1 tablet by mouth daily.        . meloxicam (MOBIC) 15 MG  tablet Take 1 tablet every other day  15 tablet  3  . metFORMIN (GLUCOPHAGE) 500 MG tablet Take 500 mg by mouth 2 (two) times daily with a meal.       . Multiple Vitamins-Minerals (HAIR/SKIN/NAILS PO) Take by mouth daily.      . Naproxen Sodium (ALEVE) 220 MG CAPS Take by mouth as needed.      Marland Kitchen VITAMIN E PO Take by mouth daily.      Marland Kitchen amLODipine (NORVASC) 10 MG tablet Take 1 tablet (10 mg total) by mouth daily.  30 tablet  3  . hydrochlorothiazide (HYDRODIURIL) 25 MG tablet Take 1 tablet (25 mg total) by mouth daily.  30 tablet  3  . losartan (COZAAR) 100 MG tablet Take 1 tablet (100 mg total) by mouth daily.  30 tablet  3   No current facility-administered medications for this visit.    History   Social History  . Marital Status: Married    Spouse Name: N/A    Number of Children: N/A  . Years of Education: N/A   Occupational History  . Not on file.   Social History Main Topics  . Smoking status: Former Smoker  Quit date: 06/03/1979  . Smokeless tobacco: Not on file  . Alcohol Use: Yes     Comment: wine occas  . Drug Use: No  . Sexual Activity: Yes    Birth Control/ Protection: Surgical   Other Topics Concern  . Not on file   Social History Narrative  . No narrative on file    Family History  Problem Relation Age of Onset  . Anesthesia problems Neg Hx   . Hypotension Neg Hx   . Malignant hyperthermia Neg Hx   . Pseudochol deficiency Neg Hx     Past Medical History  Diagnosis Date  . Complication of anesthesia     panicky before anesthesia   . Hypertension     stress test- in past recent - [employer]. [place]   . Recurrent upper respiratory infection (URI)     recent cough, treated /w OTC  . Diabetes mellitus     diag. x2 yrs. ago  . GERD (gastroesophageal reflux disease)     recent reflux, took OTC tx.  . Headache(784.0)     uses RX- unsure of name  . Arthritis     R knee, back problem     Past Surgical History  Procedure Laterality Date  .  Cesarean section      x2  . Superior oblique tuck      tummy tuck - 1986  . Tubal ligation    . Joint replacement      2005- L knee, arthroscopic procedure both knees   . Total knee arthroplasty  06/10/2011    Procedure: TOTAL KNEE ARTHROPLASTY;  Surgeon: Rudean Haskell, MD;  Location: [facility];  Service: Orthopedics;  Laterality: Right;    Patient Active Problem List   Diagnosis Date Noted  . Bradycardia, sinus 10/27/2013  . Diabetes 10/26/2013  . H/O cardiovascular stress test 10/26/2013  . Edema extremities 10/26/2013  . Complication of anesthesia   . Hypertension   . Recurrent upper respiratory infection (URI)   . GERD (gastroesophageal reflux disease)   . Headache(784.0)   . Arthritis     ROS   Patient denies fever, chills, headache, sweats, rash, change in vision, change in hearing, cough, nausea vomiting, urinary symptoms. All other systems are reviewed and are negative.  PHYSICAL EXAM   The patient is overweight. She is here with her husband. She is oriented to person time and place. Affect is normal. In general she is quite stable. Head is atraumatic. Sclera and conjunctiva are normal. There is no jugulovenous distention. Lungs are clear. Respiratory effort is nonlabored. Cardiac exam reveals S1 and S2. Abdomen is soft. There is no significant peripheral edema at this time. There no musculoskeletal deformities. There are no skin rashes.  Filed Vitals:   10/27/13 1105  BP: 142/82  Pulse: 47  Height: 5\' 4"  (1.626 m)  Weight: 212 lb (96.163 kg)   EKG is done today and reviewed by me. She has sinus bradycardia with a rate of 47. There are diffuse nonspecific ST-T wave changes. The QRS is not change since a tracing of December, 2012.  ASSESSMENT & PLAN

## 2013-11-01 ENCOUNTER — Telehealth: Payer: Self-pay | Admitting: Cardiology

## 2013-11-01 MED ORDER — FUROSEMIDE 20 MG PO TABS: 20.0000 mg | ORAL_TABLET | Freq: Every day | ORAL | Status: DC

## 2013-11-01 NOTE — Telephone Encounter (Signed)
Please try to find the records from the primary care office the percent to be scanned. There were labs in those records. I need to be sure her renal function is normal before we can adjust her diuretics. Increase clonidine to 0.2 twice a day. Proceed with obtaining her echo and her renal artery scan

## 2013-11-01 NOTE — Telephone Encounter (Signed)
**Note De-Identified Cassie Henkels Obfuscation** The pt states that she cannot tolerate an increased dose of Clonidine because the 0.1 mg dose that she is taking now makes her so sleepy that she has to take it at 3 am every morning.   Per Dr Ron Parker the pt is advised to start taking Furosemide 20 mg in the morning. She verbalized understanding.

## 2013-11-01 NOTE — Telephone Encounter (Signed)
At the pts last OV with Dr Ron Parker on 5/6 the pt was advised to stop taking Tribenzor and start taking Losartan 100 mg daily, Amlodipine 10 mg daily and HCTZ 25 mg daily and to decrease her dose of Meloxicam 15 mg to 1 tablet every other day.    The pt c/o increased edema in her feet and ankles. She states that she takes her medications between 3 and 3:30 am every morning and is concerned that her BP is elevated. Please advise.

## 2013-11-01 NOTE — Telephone Encounter (Signed)
New Message  Pt called. Pt states that her medications were changed and was advised to monitor her BP. She staets that her Bp is running High; readings listed below.Please call   05/07  124/72 12:45am 166/102 7:21 am 145/801 7:32 am 133/90 at 8:38 pm  05/08 155/113 6:32am  05/09 167/103 6:30am  05/10 186/107 at 6:33am  05/11 150/101 8:42am

## 2013-11-11 ENCOUNTER — Ambulatory Visit (HOSPITAL_BASED_OUTPATIENT_CLINIC_OR_DEPARTMENT_OTHER): Payer: PRIVATE HEALTH INSURANCE | Admitting: Cardiology

## 2013-11-11 ENCOUNTER — Ambulatory Visit (HOSPITAL_COMMUNITY): Payer: PRIVATE HEALTH INSURANCE | Attending: Cardiovascular Disease | Admitting: Radiology

## 2013-11-11 DIAGNOSIS — R600 Localized edema: Secondary | ICD-10-CM

## 2013-11-11 DIAGNOSIS — I701 Atherosclerosis of renal artery: Secondary | ICD-10-CM

## 2013-11-11 DIAGNOSIS — R9431 Abnormal electrocardiogram [ECG] [EKG]: Secondary | ICD-10-CM | POA: Insufficient documentation

## 2013-11-11 DIAGNOSIS — I10 Essential (primary) hypertension: Secondary | ICD-10-CM

## 2013-11-11 DIAGNOSIS — R609 Edema, unspecified: Secondary | ICD-10-CM | POA: Insufficient documentation

## 2013-11-11 NOTE — Progress Notes (Signed)
Renal artery duplex complete

## 2013-11-11 NOTE — Progress Notes (Signed)
Echocardiogram performed.  

## 2013-11-17 ENCOUNTER — Encounter: Payer: Self-pay | Admitting: Cardiology

## 2013-11-17 DIAGNOSIS — R9430 Abnormal result of cardiovascular function study, unspecified: Secondary | ICD-10-CM | POA: Insufficient documentation

## 2013-11-18 ENCOUNTER — Telehealth: Payer: Self-pay | Admitting: Cardiology

## 2013-12-08 ENCOUNTER — Encounter: Payer: Self-pay | Admitting: Cardiology

## 2013-12-08 ENCOUNTER — Ambulatory Visit (INDEPENDENT_AMBULATORY_CARE_PROVIDER_SITE_OTHER): Payer: PRIVATE HEALTH INSURANCE | Admitting: Cardiology

## 2013-12-08 VITALS — BP 140/80 | HR 61 | Ht 64.0 in | Wt 208.0 lb

## 2013-12-08 DIAGNOSIS — I10 Essential (primary) hypertension: Secondary | ICD-10-CM

## 2013-12-08 DIAGNOSIS — R001 Bradycardia, unspecified: Secondary | ICD-10-CM

## 2013-12-08 DIAGNOSIS — I498 Other specified cardiac arrhythmias: Secondary | ICD-10-CM

## 2013-12-08 MED ORDER — LOSARTAN POTASSIUM 100 MG PO TABS: 100.0000 mg | ORAL_TABLET | Freq: Every day | ORAL | Status: DC

## 2013-12-08 MED ORDER — AMLODIPINE BESYLATE 10 MG PO TABS: 10.0000 mg | ORAL_TABLET | Freq: Every day | ORAL | Status: DC

## 2013-12-08 MED ORDER — HYDROCHLOROTHIAZIDE 25 MG PO TABS: 25.0000 mg | ORAL_TABLET | Freq: Every day | ORAL | Status: DC

## 2013-12-08 NOTE — Assessment & Plan Note (Signed)
Her rate is good on the current medications. No change in therapy.

## 2013-12-08 NOTE — Assessment & Plan Note (Signed)
She's had a good workup. Renal artery Dopplers are normal. We have found a good combination of medications for her. These will be continued. She does not need any further workup.

## 2013-12-08 NOTE — Patient Instructions (Signed)
Your physician recommends that you continue on your current medications as directed. Please refer to the Current Medication list given to you today.  Your physician wants you to follow-up in: 1 year. You will receive a reminder letter in the mail two months in advance. If you don't receive a letter, please call our office to schedule the follow-up appointment.  

## 2013-12-08 NOTE — Progress Notes (Signed)
Patient ID: Catherine Maxwell, female   DOB: [birth year], 57 y.o.   MRN: [id number]    HPI  Patient is seen today to followup the evaluation of her hypertension. I saw her Oct 27, 2013. Two-dimensional echo has been done since then. She has excellent LV function. There is only mild left ventricular hypertrophy. She also had renal artery Dopplers. They were normal. We adjusted her medicines slightly and she's had good control. She now needs these are refilled. Overall she's doing well.  Allergies  Allergen Reactions  . Celebrex [Celecoxib] Swelling    Feet swell  . Morphine And Related Itching    Current Outpatient Prescriptions  Medication Sig Dispense Refill  . Acetaminophen (TYLENOL PO) Take by mouth as needed.      Marland Kitchen amLODipine (NORVASC) 10 MG tablet Take 1 tablet (10 mg total) by mouth daily.  30 tablet  3  . atorvastatin (LIPITOR) 10 MG tablet Take 10 mg by mouth daily.      Marland Kitchen CALCIUM PO Take by mouth daily.      . carvedilol (COREG) 6.25 MG tablet Take 6.25 mg by mouth 2 (two) times daily with a meal.       . CINNAMON PO Take 1 tablet by mouth daily.        . cloNIDine (CATAPRES) 0.1 MG tablet Take 0.1 mg by mouth 2 (two) times daily.      . Cyanocobalamin (VITAMIN B-12 PO) Take by mouth daily.      Marland Kitchen docusate sodium (COLACE) 100 MG capsule Take 100 mg by mouth daily.        . furosemide (LASIX) 20 MG tablet Take 1 tablet (20 mg total) by mouth daily.  30 tablet  3  . guaiFENesin-codeine (ROBITUSSIN AC) 100-10 MG/5ML syrup Take 5 mLs by mouth 3 (three) times daily as needed for cough.      . hydrochlorothiazide (HYDRODIURIL) 25 MG tablet Take 1 tablet (25 mg total) by mouth daily.  30 tablet  3  . losartan (COZAAR) 100 MG tablet Take 1 tablet (100 mg total) by mouth daily.  30 tablet  3  . MAGNESIUM PO Take 1 tablet by mouth daily.        . meloxicam (MOBIC) 15 MG tablet Take 1 tablet every other day  15 tablet  3  . metFORMIN (GLUCOPHAGE) 500 MG tablet Take 500 mg by mouth 2 (two)  times daily with a meal.       . Multiple Vitamins-Minerals (HAIR/SKIN/NAILS PO) Take by mouth daily.      . Naproxen Sodium (ALEVE) 220 MG CAPS Take by mouth as needed.      Marland Kitchen VITAMIN E PO Take by mouth daily.       No current facility-administered medications for this visit.    History   Social History  . Marital Status: Married    Spouse Name: N/A    Number of Children: N/A  . Years of Education: N/A   Occupational History  . Not on file.   Social History Main Topics  . Smoking status: Former Smoker    Quit date: 06/03/1979  . Smokeless tobacco: Not on file  . Alcohol Use: Yes     Comment: wine occas  . Drug Use: No  . Sexual Activity: Yes    Birth Control/ Protection: Surgical   Other Topics Concern  . Not on file   Social History Narrative  . No narrative on file    Family History  Problem Relation  Age of Onset  . Anesthesia problems Neg Hx   . Hypotension Neg Hx   . Malignant hyperthermia Neg Hx   . Pseudochol deficiency Neg Hx     Past Medical History  Diagnosis Date  . Complication of anesthesia     panicky before anesthesia   . Hypertension     stress test- in past recent - [employer]. [place]   . Recurrent upper respiratory infection (URI)     recent cough, treated /w OTC  . Diabetes mellitus     diag. x2 yrs. ago  . GERD (gastroesophageal reflux disease)     recent reflux, took OTC tx.  . Headache(784.0)     uses RX- unsure of name  . Arthritis     R knee, back problem   . Ejection fraction     Past Surgical History  Procedure Laterality Date  . Cesarean section      x2  . Superior oblique tuck      tummy tuck - 1986  . Tubal ligation    . Joint replacement      2005- L knee, arthroscopic procedure both knees   . 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 Ejection fraction   . Bradycardia, sinus 10/27/2013  . Diabetes 10/26/2013  . H/O cardiovascular stress test 10/26/2013  . Edema extremities 10/26/2013  . Complication of anesthesia   . Hypertension   . Recurrent upper respiratory infection (URI)   . GERD (gastroesophageal reflux disease)   . Headache(784.0)   . Arthritis     ROS   Patient denies fever, chills, headache, sweats, rash, change in vision, change in hearing, chest pain, cough, nausea or vomiting, urinary symptoms. She mentions that she has a sharp pain. Frequently in the right temple. This is nonspecific. All other systems are reviewed and are negative.  PHYSICAL EXAM  Patient is oriented to person time and place. Affect is normal. Head is atraumatic. Sclera and conjunctiva are normal. There is no jugulovenous distention. Lungs are clear. Respiratory effort is nonlabored. Cardiac exam reveals S1 and S2. The abdomen is soft. There is no peripheral edema. There no musculoskeletal deformities. There are no skin rashes.  Filed Vitals:   12/08/13 0858  BP: 140/80  Pulse: 61  Height: 5\' 4"  (1.626 m)  Weight: 208 lb (94.348 kg)  SpO2: 98%     ASSESSMENT & PLAN

## 2014-01-18 ENCOUNTER — Ambulatory Visit (INDEPENDENT_AMBULATORY_CARE_PROVIDER_SITE_OTHER): Payer: PRIVATE HEALTH INSURANCE | Admitting: Podiatrist

## 2014-01-18 ENCOUNTER — Ambulatory Visit: Payer: Self-pay | Admitting: Podiatrist

## 2014-01-18 ENCOUNTER — Ambulatory Visit (INDEPENDENT_AMBULATORY_CARE_PROVIDER_SITE_OTHER): Payer: PRIVATE HEALTH INSURANCE

## 2014-01-18 ENCOUNTER — Encounter: Payer: Self-pay | Admitting: Podiatrist

## 2014-01-18 VITALS — BP 160/80 | HR 68 | Resp 18

## 2014-01-18 DIAGNOSIS — M722 Plantar fascial fibromatosis: Secondary | ICD-10-CM

## 2014-01-18 DIAGNOSIS — R52 Pain, unspecified: Secondary | ICD-10-CM

## 2014-01-18 MED ORDER — TRIAMCINOLONE ACETONIDE 10 MG/ML IJ SUSP
10.0000 mg | Freq: Once | INTRAMUSCULAR | Status: AC
  Administered 2014-01-18: 10 mg

## 2014-01-18 NOTE — Patient Instructions (Signed)

## 2014-01-18 NOTE — Progress Notes (Signed)
   Subjective:    Patient ID: Catherine Maxwell, female    DOB: 07-23-1955, 58 y.o.   MRN: [id number]  HPI I AM HAVING SOME HEEL PAIN AND IT HAS BEEN GOING ON FOR ABOUT A MONTH OR SO AND IT HURTS WHEN I WALK AND HURTS ON THE BOTTOM AND HAS THE DR SHULL'S INSERTS AND BURNING AND THROBBING AND IS SORE AND TENDER    Review of Systems  Gastrointestinal:       BLOOD IN URINE  Musculoskeletal: Positive for back pain and gait problem.  Neurological: Positive for dizziness and light-headedness.  All other systems reviewed and are negative.      Objective:   Physical Exam GENERAL APPEARANCE: Alert, conversant. Appropriately groomed. No acute distress.  VASCULAR: Pedal pulses palpable and strong bilateral.  Capillary refill time is immediate to all digits,  Proximal to distal cooling it warm to warm.  Digital hair growth is present bilateral  NEUROLOGIC: sensation is intact epicritically and protectively to 5.07 monofilament at 5/5 sites bilateral.  Light touch is intact bilateral, vibratory sensation intact bilateral, achilles tendon reflex is intact bilateral.  Negative Tinel sign is elicited MUSCULOSKELETAL: Pain on palpation plantar medial aspect left foot  at insertion of plantar fascia on the medial calcaneal tubercle. Inflammation at the insertion of the plantar fascia is present. Rectus foot type is seen. DERMATOLOGIC: skin color, texture, and turger are within normal limits.  No preulcerative lesions are seen, no interdigital maceration noted.  No open lesions present.  Digital nails are asymptomatic.     Assessment & Plan:  Plantar fasciitis left  Injected the left heel with Kenalog and Marcaine mixture. A removable plantar fascial taping was applied. She was given instructions for shoe gear changes and she was wearing [employer] today. She was also given instructions for stretching. She will continue taking her meloxicam. We did discuss the positive benefits of orthotics and she  states she has some they're too thick and she doesn't wear them. She'll be seen back as needed for followup.

## 2014-04-05 NOTE — Telephone Encounter (Signed)
error 

## 2014-07-07 ENCOUNTER — Other Ambulatory Visit: Payer: Self-pay | Admitting: *Deleted

## 2014-07-07 MED ORDER — LOSARTAN POTASSIUM 100 MG PO TABS: 100.0000 mg | ORAL_TABLET | Freq: Every day | ORAL | Status: DC

## 2014-07-07 MED ORDER — HYDROCHLOROTHIAZIDE 25 MG PO TABS: 25.0000 mg | ORAL_TABLET | Freq: Every day | ORAL | Status: DC

## 2014-07-07 MED ORDER — AMLODIPINE BESYLATE 10 MG PO TABS: 10.0000 mg | ORAL_TABLET | Freq: Every day | ORAL | Status: DC

## 2014-07-09 ENCOUNTER — Other Ambulatory Visit: Payer: Self-pay | Admitting: Cardiology

## 2014-07-11 NOTE — Telephone Encounter (Signed)
Please refer to the pts PCP. Thanks. 

## 2014-07-17 NOTE — Telephone Encounter (Signed)
Agree that I will not refill.

## 2014-08-12 ENCOUNTER — Other Ambulatory Visit: Payer: Self-pay | Admitting: *Deleted

## 2014-08-12 MED ORDER — AMLODIPINE BESYLATE 10 MG PO TABS: 10.0000 mg | ORAL_TABLET | Freq: Every day | ORAL | Status: DC

## 2015-01-23 ENCOUNTER — Other Ambulatory Visit: Payer: Self-pay | Admitting: Cardiology

## 2015-04-14 ENCOUNTER — Other Ambulatory Visit: Payer: Self-pay | Admitting: Cardiology

## 2015-04-14 NOTE — Telephone Encounter (Signed)
Pt given 90 day supply of meds in August 2016,no apt made for f/u.Denied refills last seen 2015

## 2016-09-17 DIAGNOSIS — Z96653 Presence of artificial knee joint, bilateral: Secondary | ICD-10-CM | POA: Insufficient documentation

## 2016-09-17 HISTORY — DX: Presence of artificial knee joint, bilateral: Z96.653

## 2016-12-16 DIAGNOSIS — K5909 Other constipation: Secondary | ICD-10-CM | POA: Insufficient documentation

## 2016-12-16 DIAGNOSIS — E669 Obesity, unspecified: Secondary | ICD-10-CM

## 2016-12-16 DIAGNOSIS — M25562 Pain in left knee: Secondary | ICD-10-CM

## 2016-12-16 DIAGNOSIS — R062 Wheezing: Secondary | ICD-10-CM | POA: Insufficient documentation

## 2016-12-16 DIAGNOSIS — R519 Headache, unspecified: Secondary | ICD-10-CM

## 2016-12-16 DIAGNOSIS — IMO0002 Reserved for concepts with insufficient information to code with codable children: Secondary | ICD-10-CM | POA: Insufficient documentation

## 2016-12-16 DIAGNOSIS — L049 Acute lymphadenitis, unspecified: Secondary | ICD-10-CM

## 2016-12-16 DIAGNOSIS — E559 Vitamin D deficiency, unspecified: Secondary | ICD-10-CM | POA: Insufficient documentation

## 2016-12-16 DIAGNOSIS — N760 Acute vaginitis: Secondary | ICD-10-CM

## 2016-12-16 DIAGNOSIS — L659 Nonscarring hair loss, unspecified: Secondary | ICD-10-CM | POA: Insufficient documentation

## 2016-12-16 DIAGNOSIS — R42 Dizziness and giddiness: Secondary | ICD-10-CM | POA: Insufficient documentation

## 2016-12-16 DIAGNOSIS — R5383 Other fatigue: Secondary | ICD-10-CM

## 2016-12-16 DIAGNOSIS — IMO0001 Reserved for inherently not codable concepts without codable children: Secondary | ICD-10-CM

## 2016-12-16 DIAGNOSIS — R102 Pelvic and perineal pain: Secondary | ICD-10-CM

## 2016-12-16 DIAGNOSIS — R140 Abdominal distension (gaseous): Secondary | ICD-10-CM | POA: Insufficient documentation

## 2016-12-16 DIAGNOSIS — E1165 Type 2 diabetes mellitus with hyperglycemia: Secondary | ICD-10-CM

## 2016-12-16 DIAGNOSIS — M7552 Bursitis of left shoulder: Secondary | ICD-10-CM | POA: Insufficient documentation

## 2016-12-16 DIAGNOSIS — M79673 Pain in unspecified foot: Secondary | ICD-10-CM

## 2016-12-16 DIAGNOSIS — I10 Essential (primary) hypertension: Secondary | ICD-10-CM | POA: Insufficient documentation

## 2016-12-16 DIAGNOSIS — R059 Cough, unspecified: Secondary | ICD-10-CM | POA: Insufficient documentation

## 2016-12-16 DIAGNOSIS — R319 Hematuria, unspecified: Secondary | ICD-10-CM

## 2016-12-16 DIAGNOSIS — R51 Headache: Secondary | ICD-10-CM

## 2016-12-16 DIAGNOSIS — R05 Cough: Secondary | ICD-10-CM | POA: Insufficient documentation

## 2016-12-16 DIAGNOSIS — J329 Chronic sinusitis, unspecified: Secondary | ICD-10-CM

## 2016-12-16 DIAGNOSIS — Z8669 Personal history of other diseases of the nervous system and sense organs: Secondary | ICD-10-CM | POA: Insufficient documentation

## 2016-12-16 DIAGNOSIS — J40 Bronchitis, not specified as acute or chronic: Secondary | ICD-10-CM | POA: Insufficient documentation

## 2016-12-16 HISTORY — DX: Chronic sinusitis, unspecified: J32.9

## 2016-12-16 HISTORY — DX: Other fatigue: R53.83

## 2016-12-16 HISTORY — DX: Other constipation: K59.09

## 2016-12-16 HISTORY — DX: Vitamin D deficiency, unspecified: E55.9

## 2016-12-16 HISTORY — DX: Bursitis of left shoulder: M75.52

## 2016-12-16 HISTORY — DX: Acute lymphadenitis, unspecified: L04.9

## 2016-12-16 HISTORY — DX: Abdominal distension (gaseous): R14.0

## 2016-12-16 HISTORY — DX: Hematuria, unspecified: R31.9

## 2016-12-16 HISTORY — DX: Essential (primary) hypertension: I10

## 2016-12-16 HISTORY — DX: Dizziness and giddiness: R42

## 2016-12-16 HISTORY — DX: Type 2 diabetes mellitus with hyperglycemia: E11.65

## 2016-12-16 HISTORY — DX: Cough, unspecified: R05.9

## 2016-12-16 HISTORY — DX: Pain in unspecified foot: M79.673

## 2016-12-16 HISTORY — DX: Bronchitis, not specified as acute or chronic: J40

## 2016-12-16 HISTORY — DX: Pain in left knee: M25.562

## 2016-12-16 HISTORY — DX: Obesity, unspecified: E66.9

## 2016-12-16 HISTORY — DX: Reserved for concepts with insufficient information to code with codable children: IMO0002

## 2016-12-16 HISTORY — DX: Pelvic and perineal pain: R10.2

## 2016-12-16 HISTORY — DX: Personal history of other diseases of the nervous system and sense organs: Z86.69

## 2016-12-16 HISTORY — DX: Nonscarring hair loss, unspecified: L65.9

## 2016-12-16 HISTORY — DX: Headache, unspecified: R51.9

## 2016-12-16 HISTORY — DX: Reserved for inherently not codable concepts without codable children: IMO0001

## 2016-12-16 HISTORY — DX: Acute vaginitis: N76.0

## 2016-12-16 HISTORY — DX: Wheezing: R06.2

## 2017-01-10 DIAGNOSIS — G609 Hereditary and idiopathic neuropathy, unspecified: Secondary | ICD-10-CM | POA: Insufficient documentation

## 2017-01-10 DIAGNOSIS — E782 Mixed hyperlipidemia: Secondary | ICD-10-CM | POA: Insufficient documentation

## 2017-01-10 HISTORY — DX: Mixed hyperlipidemia: E78.2

## 2017-01-10 HISTORY — DX: Hereditary and idiopathic neuropathy, unspecified: G60.9

## 2017-03-07 DIAGNOSIS — B372 Candidiasis of skin and nail: Secondary | ICD-10-CM

## 2017-03-07 HISTORY — DX: Candidiasis of skin and nail: B37.2

## 2017-05-14 DIAGNOSIS — G8929 Other chronic pain: Secondary | ICD-10-CM

## 2017-05-14 DIAGNOSIS — M549 Dorsalgia, unspecified: Secondary | ICD-10-CM

## 2017-05-14 HISTORY — DX: Other chronic pain: G89.29

## 2017-05-16 DIAGNOSIS — M5412 Radiculopathy, cervical region: Secondary | ICD-10-CM

## 2017-05-16 DIAGNOSIS — J988 Other specified respiratory disorders: Secondary | ICD-10-CM | POA: Insufficient documentation

## 2017-05-16 DIAGNOSIS — M25512 Pain in left shoulder: Secondary | ICD-10-CM | POA: Insufficient documentation

## 2017-05-16 HISTORY — DX: Other specified respiratory disorders: J98.8

## 2017-05-16 HISTORY — DX: Pain in left shoulder: M25.512

## 2017-05-16 HISTORY — DX: Radiculopathy, cervical region: M54.12

## 2017-06-30 DIAGNOSIS — R0789 Other chest pain: Secondary | ICD-10-CM | POA: Insufficient documentation

## 2017-06-30 HISTORY — DX: Other chest pain: R07.89

## 2017-08-14 DIAGNOSIS — M65311 Trigger thumb, right thumb: Secondary | ICD-10-CM | POA: Insufficient documentation

## 2017-08-14 DIAGNOSIS — M65331 Trigger finger, right middle finger: Secondary | ICD-10-CM

## 2017-08-14 HISTORY — DX: Trigger thumb, right thumb: M65.311

## 2017-08-14 HISTORY — DX: Trigger finger, right middle finger: M65.331

## 2017-11-12 DIAGNOSIS — Z9889 Other specified postprocedural states: Secondary | ICD-10-CM

## 2017-11-12 HISTORY — DX: Other specified postprocedural states: Z98.890

## 2018-01-21 ENCOUNTER — Other Ambulatory Visit: Payer: Self-pay

## 2018-02-09 NOTE — Progress Notes (Signed)
Cardiology Office Note:    Date:  02/10/2018   ID:  Catherine Maxwell, DOB Mar 14, 1956, MRN [id number]  PCP:  Myrlene Broker, MD  Cardiologist:  Shirlee More, MD   Referring MD: Myrlene Broker, MD  ASSESSMENT:    1. Hypertensive heart disease without heart failure   2. Bradycardia, sinus   3. Type 2 diabetes mellitus with complication, without long-term current use of insulin ([place])   4. Hyperlipidemia, unspecified hyperlipidemia type    PLAN:    In order of problems listed above:  1. Poorly controlled, she takes a multidrug regimen has peripheral edema bilateral low-dose loop diuretic furosemide 20 mg every other day recheck renal function in 2 weeks.  I will see her back in the office in 6 weeks and continue her other multidrug regimen including calcium channel blocker clonidine hydralazine ARB thiazide diuretic and beta-blocker 2. Stable continue current beta-blocker 3. Stable managed by her PCP 4. Stable continue statin lipids are at target  Next appointment 6 to 8 weeks   Medication Adjustments/Labs and Tests Ordered: Current medicines are reviewed at length with the patient today.  Concerns regarding medicines are outlined above.  Orders Placed This Encounter  Procedures  . Basic metabolic panel   Meds ordered this encounter  Medications  . DISCONTD: furosemide (LASIX) 20 MG tablet    Sig: Take once a day on Mondays Wednesdays and Fridays    Dispense:  30 tablet    Refill:  3  . furosemide (LASIX) 20 MG tablet    Sig: Take once a day on Mondays Wednesdays and Fridays    Dispense:  30 tablet    Refill:  3     Chief Complaint  Patient presents with  . Follow-up    History of Present Illness:    Catherine Maxwell is a 62 y.o. female with T2 DM hypertension hyperlipidemia type 2 diabetes and sinus bradycardia who is being seen today for the evaluation of hypertension at the request of Myrlene Broker, MD last seen on 07/05/15 Recently she complained of  headache had blood pressures greater than 190/130 was seen by Dr. Unk Lightning adjustments to medications but is still running in the range of 1 93-7 60 systolic at home.  She has noticed recently she developed peripheral edema no chest pain shortness of breath palpitation or syncope she is compliant with her medications does not add salt no alcohol abuse and does not have sleep apnea.  Past Medical History:  Diagnosis Date  . Arthritis    R knee, back problem   . Complication of anesthesia    panicky before anesthesia   . Diabetes mellitus    diag. x2 yrs. ago  . Ejection fraction   . GERD (gastroesophageal reflux disease)    recent reflux, took OTC tx.  . Headache(784.0)    uses RX- unsure of name  . Hypertension    stress test- in past recent - [employer]. [place]   . Recurrent upper respiratory infection (URI)    recent cough, treated /w OTC    Past Surgical History:  Procedure Laterality Date  . CESAREAN SECTION     x2  . JOINT REPLACEMENT     2005- L knee, arthroscopic procedure both knees   . SUPERIOR OBLIQUE TUCK     tummy tuck - 1986  . TOTAL KNEE ARTHROPLASTY  06/10/2011   Procedure: TOTAL KNEE ARTHROPLASTY;  Surgeon: Rudean Haskell, MD;  Location: [facility];  Service: Orthopedics;  Laterality: Right;  . TUBAL LIGATION      Current Medications: Current Meds  Medication Sig  . amLODipine (NORVASC) 10 MG tablet TAKE 1 TABLET (10 MG TOTAL) BY MOUTH DAILY.  Marland Kitchen aspirin EC 81 MG tablet Take 40.5 mg by mouth daily.  Marland Kitchen atorvastatin (LIPITOR) 10 MG tablet Take 10 mg by mouth daily.  Marland Kitchen CALCIUM PO Take by mouth daily.  Marland Kitchen CINNAMON PO Take 1 tablet by mouth daily.    . cloNIDine (CATAPRES) 0.1 MG tablet Take 0.1 mg by mouth 2 (two) times daily.  . furosemide (LASIX) 20 MG tablet Take once a day on Mondays Wednesdays and Fridays  . glyBURIDE (DIABETA) 5 MG tablet Take 5 mg by mouth 2 (two) times daily as needed.  . hydrALAZINE (APRESOLINE) 25 MG tablet Take 50 mg by mouth 3  (three) times daily.  . irbesartan-hydrochlorothiazide (AVALIDE) 300-12.5 MG tablet Take 1 tablet by mouth daily.  . meclizine (ANTIVERT) 25 MG tablet Take 1 tablet by mouth 3 (three) times daily as needed.  . methocarbamol (ROBAXIN) 750 MG tablet Take 1 tablet by mouth 3 (three) times daily as needed.  . metoprolol succinate (TOPROL-XL) 50 MG 24 hr tablet Take 50 mg by mouth daily.  . [DISCONTINUED] furosemide (LASIX) 20 MG tablet Take once a day on Mondays Wednesdays and Fridays  . [DISCONTINUED] hydrochlorothiazide (HYDRODIURIL) 25 MG tablet TAKE 1 TABLET (25 MG TOTAL) BY MOUTH DAILY. (Patient taking differently: Take 50 mg by mouth 3 (three) times daily. )     Allergies:   Celebrex [celecoxib] and Morphine and related   Social History   Socioeconomic History  . Marital status: Married    Spouse name: Not on file  . Number of children: Not on file  . Years of education: Not on file  . Highest education level: Not on file  Occupational History  . Not on file  Social Needs  . Financial resource strain: Not on file  . Food insecurity:    Worry: Not on file    Inability: Not on file  . Transportation needs:    Medical: Not on file    Non-medical: Not on file  Tobacco Use  . Smoking status: Former Smoker    Last attempt to quit: 06/03/1979    Years since quitting: 38.7  . Smokeless tobacco: Never Used  Substance and Sexual Activity  . Alcohol use: Yes    Comment: wine occas  . Drug use: No  . Sexual activity: Yes    Birth control/protection: Surgical  Lifestyle  . Physical activity:    Days per week: Not on file    Minutes per session: Not on file  . Stress: Not on file  Relationships  . Social connections:    Talks on phone: Not on file    Gets together: Not on file    Attends religious service: Not on file    Active member of club or organization: Not on file    Attends meetings of clubs or organizations: Not on file    Relationship status: Not on file  Other  Topics Concern  . Not on file  Social History Narrative  . Not on file     Family History: The patient's family history is negative for Anesthesia problems, Hypotension, Malignant hyperthermia, and Pseudochol deficiency.  ROS:   ROS Please see the history of present illness.     All other systems reviewed and are negative.  EKGs/Labs/Other Studies Reviewed:    The following  studies were reviewed today:   EKG:  EKG is  ordered today.  The ekg ordered today demonstrates sinus rhythm nonspecific T waves  Recent Labs:   01/16/18   A1C 7.4% CMP normal No results found for requested labs within last 8760 hours.  Recent Lipid Panel   Chol 163 HDL55 LDL 103 No results found for: CHOL, TRIG, HDL, CHOLHDL, VLDL, LDLCALC, LDLDIRECT  Physical Exam:    VS:  BP 140/90 (BP Location: Right Arm, Patient Position: Sitting, Cuff Size: Large)   Pulse (!) 57   Ht 5' 4.5" (1.638 m)   Wt 223 lb 12.8 oz (101.5 kg)   SpO2 97%   BMI 37.82 kg/m     Wt Readings from Last 3 Encounters:  02/10/18 223 lb 12.8 oz (101.5 kg)  12/08/13 208 lb (94.3 kg)  10/27/13 212 lb (96.2 kg)     GEN: Central obesity well nourished, well developed in no acute distress HEENT: Normal NECK: No JVD; No carotid bruits LYMPHATICS: No lymphadenopathy CARDIAC: RRR, no murmurs, rubs, gallops RESPIRATORY:  Clear to auscultation without rales, wheezing or rhonchi  ABDOMEN: Soft, non-tender, non-distended MUSCULOSKELETAL: 2+ bilateral to the knee edema; No deformity  SKIN: Warm and dry NEUROLOGIC:  Alert and oriented x 3 PSYCHIATRIC:  Normal affect     Signed, Shirlee More, MD  02/10/2018 3:57 PM    [facility]

## 2018-02-10 ENCOUNTER — Ambulatory Visit (INDEPENDENT_AMBULATORY_CARE_PROVIDER_SITE_OTHER): Payer: BLUE CROSS/BLUE SHIELD | Admitting: Cardiology

## 2018-02-10 ENCOUNTER — Encounter: Payer: Self-pay | Admitting: Cardiology

## 2018-02-10 VITALS — BP 140/90 | HR 57 | Ht 64.5 in | Wt 223.8 lb

## 2018-02-10 DIAGNOSIS — I119 Hypertensive heart disease without heart failure: Secondary | ICD-10-CM

## 2018-02-10 DIAGNOSIS — E785 Hyperlipidemia, unspecified: Secondary | ICD-10-CM

## 2018-02-10 DIAGNOSIS — E118 Type 2 diabetes mellitus with unspecified complications: Secondary | ICD-10-CM | POA: Diagnosis not present

## 2018-02-10 DIAGNOSIS — R001 Bradycardia, unspecified: Secondary | ICD-10-CM

## 2018-02-10 MED ORDER — FUROSEMIDE 20 MG PO TABS: ORAL_TABLET | ORAL | 3 refills | Status: DC

## 2018-02-10 NOTE — Patient Instructions (Addendum)
Medication Instructions:  Your physician has recommended you make the following change in your medication:   Change: Furosemide 20 mg once a day on Monday's Wednesday's and Fridays.   Labwork: Your physician recommends that you return for lab work in 2 weeks: Bmp   Testing/Procedures: None.  Follow-Up: Your physician recommends that you schedule a follow-up appointment in: 6 weeks.    Any Other Special Instructions Will Be Listed Below (If Applicable).     If you need a refill on your cardiac medications before your next appointment, please call your pharmacy.    Managing Your Hypertension Hypertension is commonly called high blood pressure. This is when the force of your blood pressing against the walls of your arteries is too strong. Arteries are blood vessels that carry blood from your heart throughout your body. Hypertension forces the heart to work harder to pump blood, and may cause the arteries to become narrow or stiff. Having untreated or uncontrolled hypertension can cause heart attack, stroke, kidney disease, and other problems. What are blood pressure readings? A blood pressure reading consists of a higher number over a lower number. Ideally, your blood pressure should be below 120/80. The first ("top") number is called the systolic pressure. It is a measure of the pressure in your arteries as your heart beats. The second ("bottom") number is called the diastolic pressure. It is a measure of the pressure in your arteries as the heart relaxes. What does my blood pressure reading mean? Blood pressure is classified into four stages. Based on your blood pressure reading, your health care provider may use the following stages to determine what type of treatment you need, if any. Systolic pressure and diastolic pressure are measured in a unit called mm Hg. Normal  Systolic pressure: below 295.  Diastolic pressure: below 80. Elevated  Systolic pressure: 284-132.  Diastolic  pressure: below 80. Hypertension stage 1  Systolic pressure: 440-102.  Diastolic pressure: 72-53. Hypertension stage 2  Systolic pressure: 664 or above.  Diastolic pressure: 90 or above. What health risks are associated with hypertension? Managing your hypertension is an important responsibility. Uncontrolled hypertension can lead to:  A heart attack.  A stroke.  A weakened blood vessel (aneurysm).  Heart failure.  Kidney damage.  Eye damage.  Metabolic syndrome.  Memory and concentration problems.  What changes can I make to manage my hypertension? Hypertension can be managed by making lifestyle changes and possibly by taking medicines. Your health care provider will help you make a plan to bring your blood pressure within a normal range. Eating and drinking  Eat a diet that is high in fiber and potassium, and low in salt (sodium), added sugar, and fat. An example eating plan is called the DASH (Dietary Approaches to Stop Hypertension) diet. To eat this way: ? Eat plenty of fresh fruits and vegetables. Try to fill half of your plate at each meal with fruits and vegetables. ? Eat whole grains, such as whole wheat pasta, brown rice, or whole grain bread. Fill about one quarter of your plate with whole grains. ? Eat low-fat diary products. ? Avoid fatty cuts of meat, processed or cured meats, and poultry with skin. Fill about one quarter of your plate with lean proteins such as fish, chicken without skin, beans, eggs, and tofu. ? Avoid premade and processed foods. These tend to be higher in sodium, added sugar, and fat.  Reduce your daily sodium intake. Most people with hypertension should eat less than 1,500 mg of  sodium a day.  Limit alcohol intake to no more than 1 drink a day for nonpregnant women and 2 drinks a day for men. One drink equals 12 oz of beer, 5 oz of wine, or 1 oz of hard liquor. Lifestyle  Work with your health care provider to maintain a healthy body  weight, or to lose weight. Ask what an ideal weight is for you.  Get at least 30 minutes of exercise that causes your heart to beat faster (aerobic exercise) most days of the week. Activities may include walking, swimming, or biking.  Include exercise to strengthen your muscles (resistance exercise), such as weight lifting, as part of your weekly exercise routine. Try to do these types of exercises for 30 minutes at least 3 days a week.  Do not use any products that contain nicotine or tobacco, such as cigarettes and e-cigarettes. If you need help quitting, ask your health care provider.  Control any long-term (chronic) conditions you have, such as high cholesterol or diabetes. Monitoring  Monitor your blood pressure at home as told by your health care provider. Your personal target blood pressure may vary depending on your medical conditions, your age, and other factors.  Have your blood pressure checked regularly, as often as told by your health care provider. Working with your health care provider  Review all the medicines you take with your health care provider because there may be side effects or interactions.  Talk with your health care provider about your diet, exercise habits, and other lifestyle factors that may be contributing to hypertension.  Visit your health care provider regularly. Your health care provider can help you create and adjust your plan for managing hypertension. Will I need medicine to control my blood pressure? Your health care provider may prescribe medicine if lifestyle changes are not enough to get your blood pressure under control, and if:  Your systolic blood pressure is 130 or higher.  Your diastolic blood pressure is 80 or higher.  Take medicines only as told by your health care provider. Follow the directions carefully. Blood pressure medicines must be taken as prescribed. The medicine does not work as well when you skip doses. Skipping doses also puts  you at risk for problems. Contact a health care provider if:  You think you are having a reaction to medicines you have taken.  You have repeated (recurrent) headaches.  You feel dizzy.  You have swelling in your ankles.  You have trouble with your vision. Get help right away if:  You develop a severe headache or confusion.  You have unusual weakness or numbness, or you feel faint.  You have severe pain in your chest or abdomen.  You vomit repeatedly.  You have trouble breathing. Summary  Hypertension is when the force of blood pumping through your arteries is too strong. If this condition is not controlled, it may put you at risk for serious complications.  Your personal target blood pressure may vary depending on your medical conditions, your age, and other factors. For most people, a normal blood pressure is less than 120/80.  Hypertension is managed by lifestyle changes, medicines, or both. Lifestyle changes include weight loss, eating a healthy, low-sodium diet, exercising more, and limiting alcohol. This information is not intended to replace advice given to you by your health care provider. Make sure you discuss any questions you have with your health care provider. Document Released: 03/04/2012 Document Revised: 05/08/2016 Document Reviewed: 05/08/2016 Elsevier Interactive Patient Education  2018  [employer].

## 2018-02-11 NOTE — Addendum Note (Signed)
Addended by: Anselm Pancoast on: 02/11/2018 09:47 AM   Modules accepted: Orders

## 2018-02-25 LAB — BASIC METABOLIC PANEL
BUN/Creatinine Ratio: 15 (ref 12–28)
BUN: 11 mg/dL (ref 8–27)
CO2: 29 mmol/L (ref 20–29)
Calcium: 9.8 mg/dL (ref 8.7–10.3)
Chloride: 99 mmol/L (ref 96–106)
Creatinine, Ser: 0.74 mg/dL (ref 0.57–1.00)
GFR calc Af Amer: 101 mL/min/{1.73_m2} (ref >59)
GFR, EST NON AFRICAN AMERICAN: 88 mL/min/{1.73_m2} (ref >59)
GLUCOSE: 148 mg/dL — AB (ref 65–99)
Potassium: 4.1 mmol/L (ref 3.5–5.2)
SODIUM: 143 mmol/L (ref 134–144)

## 2018-03-23 NOTE — Progress Notes (Signed)
Cardiology Office Note:    Date:  03/24/2018   ID:  WINNI Maxwell, DOB April 12, 1956, MRN [id number]  PCP:  Myrlene Broker, MD  Cardiologist:  Shirlee More, MD    Referring MD: Myrlene Broker, MD    ASSESSMENT:    1. Essential hypertension    PLAN:    In order of problems listed above:  1. Her blood pressure is improved repeat by me 142/76 however unfortunately her edema is unaffected related to her calcium channel blocker in which such severe hypertension she is forced to continue to take it.  She will continue her current multidrug regimen.  I suspect most of her edema is related to the calcium channel blocker 2. She is complaining of dysuria I will send a urinalysis of our office can do the culture and for the results to her PCP   Next appointment: 6 months   Medication Adjustments/Labs and Tests Ordered: Current medicines are reviewed at length with the patient today.  Concerns regarding medicines are outlined above.  No orders of the defined types were placed in this encounter.  No orders of the defined types were placed in this encounter.   Chief Complaint  Patient presents with  . 6 week follow up    for hypertension    History of Present Illness:   Catherine Maxwell is a 62 y.o. female with a hx of T2 DM hypertension hyperlipidemia type 2 diabetes and sinus bradycardia last seen 02/10/18  for elevated BP and started on a diuretic.Marland Kitchen Compliance with diet, lifestyle and medications: Yes  She is not checking her blood pressure regularly but typically she does is in the range of 140/80 she takes a multidrug regimen still has peripheral edema likely more related to calcium channel blocker and fluid overload and will continue her current loop diuretic with her refractory hypertension.  Follow-up renal function was normal and potassium she is complaining of urinary frequency some dysuria I will send a UA and if possible culture my office and forward to her PCP Past  Medical History:  Diagnosis Date  . Arthritis    R knee, back problem   . Complication of anesthesia    panicky before anesthesia   . Diabetes mellitus    diag. x2 yrs. ago  . Ejection fraction   . GERD (gastroesophageal reflux disease)    recent reflux, took OTC tx.  . Headache(784.0)    uses RX- unsure of name  . Hypertension    stress test- in past recent - [employer]. [place]   . Recurrent upper respiratory infection (URI)    recent cough, treated /w OTC    Past Surgical History:  Procedure Laterality Date  . CESAREAN SECTION     x2  . JOINT REPLACEMENT     2005- L knee, arthroscopic procedure both knees   . SUPERIOR OBLIQUE TUCK     tummy tuck - 1986  . TOTAL KNEE ARTHROPLASTY  06/10/2011   Procedure: TOTAL KNEE ARTHROPLASTY;  Surgeon: Rudean Haskell, MD;  Location: [place];  Service: Orthopedics;  Laterality: Right;  . TUBAL LIGATION      Current Medications: Current Meds  Medication Sig  . amLODipine (NORVASC) 10 MG tablet TAKE 1 TABLET (10 MG TOTAL) BY MOUTH DAILY.  Marland Kitchen aspirin EC 81 MG tablet Take 40.5 mg by mouth daily.  Marland Kitchen atorvastatin (LIPITOR) 10 MG tablet Take 10 mg by mouth daily.  Marland Kitchen CALCIUM PO Take by mouth daily.  Marland Kitchen CINNAMON  PO Take 1 tablet by mouth daily.    . cloNIDine (CATAPRES) 0.1 MG tablet Take 0.1 mg by mouth 2 (two) times daily.  . furosemide (LASIX) 20 MG tablet Take once a day on Mondays Wednesdays and Fridays  . glyBURIDE (DIABETA) 5 MG tablet Take 5 mg by mouth 2 (two) times daily as needed.  . hydrALAZINE (APRESOLINE) 25 MG tablet Take 50 mg by mouth 3 (three) times daily.  . irbesartan-hydrochlorothiazide (AVALIDE) 300-12.5 MG tablet Take 1 tablet by mouth daily.  . meclizine (ANTIVERT) 25 MG tablet Take 1 tablet by mouth 3 (three) times daily as needed.  . methocarbamol (ROBAXIN) 750 MG tablet Take 1 tablet by mouth 3 (three) times daily as needed.  . metoprolol succinate (TOPROL-XL) 50 MG 24 hr tablet Take 50 mg by mouth daily.      Allergies:   Celebrex [celecoxib] and Morphine and related   Social History   Socioeconomic History  . Marital status: Married    Spouse name: Not on file  . Number of children: Not on file  . Years of education: Not on file  . Highest education level: Not on file  Occupational History  . Not on file  Social Needs  . Financial resource strain: Not on file  . Food insecurity:    Worry: Not on file    Inability: Not on file  . Transportation needs:    Medical: Not on file    Non-medical: Not on file  Tobacco Use  . Smoking status: Former Smoker    Last attempt to quit: 06/03/1979    Years since quitting: 38.8  . Smokeless tobacco: Never Used  Substance and Sexual Activity  . Alcohol use: Yes    Comment: wine occas  . Drug use: No  . Sexual activity: Yes    Birth control/protection: Surgical  Lifestyle  . Physical activity:    Days per week: Not on file    Minutes per session: Not on file  . Stress: Not on file  Relationships  . Social connections:    Talks on phone: Not on file    Gets together: Not on file    Attends religious service: Not on file    Active member of club or organization: Not on file    Attends meetings of clubs or organizations: Not on file    Relationship status: Not on file  Other Topics Concern  . Not on file  Social History Narrative  . Not on file     Family History: The patient's family history is negative for Anesthesia problems, Hypotension, Malignant hyperthermia, and Pseudochol deficiency. ROS:   Please see the history of present illness.    All other systems reviewed and are negative.  EKGs/Labs/Other Studies Reviewed:    The following studies were reviewed today:  Recent Labs: 02/24/2018: BUN 11; Creatinine, Ser 0.74; Potassium 4.1; Sodium 143  Recent Lipid Panel No results found for: CHOL, TRIG, HDL, CHOLHDL, VLDL, LDLCALC, LDLDIRECT  Physical Exam:    VS:  BP (!) 142/70   Pulse 62   Ht 5' 4.5" (1.638 m)   Wt 231 lb  6.4 oz (105 kg)   SpO2 97%   BMI 39.11 kg/m     Wt Readings from Last 3 Encounters:  03/24/18 231 lb 6.4 oz (105 kg)  02/10/18 223 lb 12.8 oz (101.5 kg)  12/08/13 208 lb (94.3 kg)     GEN:  Well nourished, well developed in no acute distress HEENT: Normal NECK:  No JVD; No carotid bruits LYMPHATICS: No lymphadenopathy CARDIAC: RRR, no murmurs, rubs, gallops RESPIRATORY:  Clear to auscultation without rales, wheezing or rhonchi  ABDOMEN: Soft, non-tender, non-distended MUSCULOSKELETAL:  No edema; No deformity  SKIN: Warm and dry NEUROLOGIC:  Alert and oriented x 3 PSYCHIATRIC:  Normal affect    Signed, Shirlee More, MD  03/24/2018 1:45 PM    [facility]

## 2018-03-24 ENCOUNTER — Encounter: Payer: Self-pay | Admitting: Cardiology

## 2018-03-24 ENCOUNTER — Ambulatory Visit (INDEPENDENT_AMBULATORY_CARE_PROVIDER_SITE_OTHER): Payer: BLUE CROSS/BLUE SHIELD | Admitting: Cardiology

## 2018-03-24 VITALS — BP 142/70 | HR 62 | Ht 64.5 in | Wt 231.4 lb

## 2018-03-24 DIAGNOSIS — R300 Dysuria: Secondary | ICD-10-CM | POA: Diagnosis not present

## 2018-03-24 DIAGNOSIS — I10 Essential (primary) hypertension: Secondary | ICD-10-CM | POA: Diagnosis not present

## 2018-03-24 NOTE — Addendum Note (Signed)
Addended by: Ashok Norris on: 03/24/2018 02:20 PM   Modules accepted: Orders

## 2018-03-24 NOTE — Patient Instructions (Signed)
Medication Instructions:  Your physician recommends that you continue on your current medications as directed. Please refer to the Current Medication list given to you today.   Labwork: None.   Testing/Procedures: Dr. Bettina Gavia has recommended you have a urinalysis done.   Follow-Up: Your physician wants you to follow-up in: 6 months. You will receive a reminder letter in the mail two months in advance. If you don't receive a letter, please call our office to schedule the follow-up appointment.   Any Other Special Instructions Will Be Listed Below (If Applicable).     If you need a refill on your cardiac medications before your next appointment, please call your pharmacy.

## 2018-03-25 LAB — URINALYSIS
Bilirubin, UA: NEGATIVE
GLUCOSE, UA: NEGATIVE
KETONES UA: NEGATIVE
NITRITE UA: NEGATIVE
Protein, UA: NEGATIVE
RBC, UA: NEGATIVE
Specific Gravity, UA: 1.013 (ref 1.005–1.030)
UUROB: 0.2 mg/dL (ref 0.2–1.0)
pH, UA: 6.5 (ref 5.0–7.5)

## 2018-03-28 LAB — URINE CULTURE

## 2018-08-18 ENCOUNTER — Telehealth: Payer: Self-pay | Admitting: Cardiology

## 2018-08-18 ENCOUNTER — Encounter: Payer: Self-pay | Admitting: Internal Medicine

## 2018-08-18 DIAGNOSIS — R001 Bradycardia, unspecified: Secondary | ICD-10-CM

## 2018-08-18 DIAGNOSIS — R079 Chest pain, unspecified: Secondary | ICD-10-CM

## 2018-08-18 DIAGNOSIS — E119 Type 2 diabetes mellitus without complications: Secondary | ICD-10-CM

## 2018-08-18 DIAGNOSIS — E785 Hyperlipidemia, unspecified: Secondary | ICD-10-CM

## 2018-08-18 DIAGNOSIS — I10 Essential (primary) hypertension: Secondary | ICD-10-CM

## 2018-08-18 NOTE — Telephone Encounter (Signed)
RN called both home and cell numbers and left vm to call back with details relating to her chest pain.

## 2018-08-18 NOTE — Telephone Encounter (Signed)
Patient called this am and she has been having chest pain for a couple of weeks and she is not having chest pain as we speak. Please cal patient cause hse wants to see someone as soon as she can.

## 2018-08-18 NOTE — Telephone Encounter (Signed)
Spoke with patient and she states she has been having chest pain under her left breast intermittently for over a week. It comes and goes, with no other symptoms noted.Advised by Dr. Bettina Gavia,  to go to ED to have troponin drawn due to the new onset of this pain. Patient apprehensive because she does not want to sit in waiting area of ED. Patient also offered an appt with Dr. Bettina Gavia tomorrow 08/19/18 but declined because she didn't want to drive to [facility] office. Alternately offered 1120 appt with Dr. Docia Furl in [place] but would prefer to see Dr. Raliegh Ip.. Patient coerced to go directly to the emergency room and RN will call in the morning for updates.

## 2018-08-19 ENCOUNTER — Telehealth: Payer: Self-pay

## 2018-08-19 DIAGNOSIS — E785 Hyperlipidemia, unspecified: Secondary | ICD-10-CM | POA: Diagnosis not present

## 2018-08-19 DIAGNOSIS — R079 Chest pain, unspecified: Secondary | ICD-10-CM | POA: Diagnosis not present

## 2018-08-19 DIAGNOSIS — I10 Essential (primary) hypertension: Secondary | ICD-10-CM | POA: Diagnosis not present

## 2018-08-19 DIAGNOSIS — E119 Type 2 diabetes mellitus without complications: Secondary | ICD-10-CM | POA: Diagnosis not present

## 2018-08-19 NOTE — Telephone Encounter (Signed)
Patient returned your call, please return call

## 2018-08-19 NOTE — Telephone Encounter (Signed)
Called patient to verify if she would be willing to have 2 day exercise myoview performed. Per Dr. Joya Gaskins request after [facility] ED visit due to chest pain. Patient in agreement, given verbal instructions and letter mailed.

## 2018-08-19 NOTE — Telephone Encounter (Signed)
RN called to check on patient. Catherine Maxwell states she was seen in ED yesterday, troponin was negative. She was told EKG was abnormal but still waiting for results.She feels better today but still have chest pain intermittently. Information relayed to Dr. Bettina Gavia for further advisement.

## 2018-09-01 ENCOUNTER — Other Ambulatory Visit: Payer: Self-pay | Admitting: Cardiology

## 2018-09-17 ENCOUNTER — Telehealth: Payer: Self-pay | Admitting: *Deleted

## 2018-09-17 NOTE — Telephone Encounter (Signed)
Cancelled appt for myo

## 2018-09-22 ENCOUNTER — Ambulatory Visit: Payer: BLUE CROSS/BLUE SHIELD

## 2018-09-22 ENCOUNTER — Ambulatory Visit: Payer: BLUE CROSS/BLUE SHIELD | Admitting: Cardiology

## 2018-09-23 ENCOUNTER — Ambulatory Visit: Payer: BLUE CROSS/BLUE SHIELD

## 2018-09-24 ENCOUNTER — Telehealth: Payer: Self-pay

## 2018-09-24 NOTE — Telephone Encounter (Signed)
Spoke with patient regarding appointment for next week 09-30-2018.   Patient did not have Myoview that was scheduled for 09-22-2018, as it was canceled due to Covid-19.  Patient reports having "chest pain that comes and goes just like I was having when I went to [facility] in February."  She does not take NTG for these episodes.  She does not have complaints of shortness of breath.    Patient indicates that she is very upset that her Myoview was canceled and that " I am not going to wait around for corona virus to go away because I might be dead."  Patient was notified that she could move appointment to 09-25-2018.  I offered virtual visit. Patient declined.  I also offered patient the option of a WebEx appointment and she declined because it wasn't in person.  She states" she is not being billed for a telephone visit.  I am only paying for face-to-face visits.  If I have to, I will go to my PCP or back to hospital."  Several attempts were made to move appointment to a sooner date, but again she refused.  Patient was made aware that Dr Bettina Gavia would be made aware of her cancelling her appointment and her recurrent chest pains.  [facility] records scanned in Media for Dr Bettina Gavia to review.  Patient advised our office would contact her with any further recommendations.  No further questions.

## 2018-09-24 NOTE — Telephone Encounter (Signed)
Can you check if any outpatient myoviews are being done in [facility] or [facility]

## 2018-09-24 NOTE — Telephone Encounter (Signed)
Left message for patient to return call-appt needs to be cancelled as she did not have myoview due to covid 19.

## 2018-09-25 ENCOUNTER — Telehealth: Payer: Self-pay

## 2018-09-25 NOTE — Telephone Encounter (Signed)
-----   Message from [name], MD sent at 09/25/2018  7:55 AM EDT ----- Catherine Maxwell I certainly understand her concerns, however I feel strongly she needs to have a virtual visit and I prefer the [employer] WebEx and I would make this available to her but coming to the office is not an option at this time.  I feel strongly him doing the right thing and this is within the guidelines recently received from [place] group and is in her best interest to avoid exposing her to the current COVID-19.  Please reach out and asked her again if she will accept a virtual visit if the answer is now asked her to call back if she changes her mind

## 2018-09-25 NOTE — Telephone Encounter (Signed)
Thank you I think we did everything we could to accommodate her, if she calls back we can go ahead and schedule her for a virtual visit.

## 2018-09-25 NOTE — Telephone Encounter (Signed)
Returned call to patient to discuss the importance of virtual visits during the pandemic with Covid-19. She again refused virtual visits.  I also offered to set her up for exercise myoview at [facility] location, but she refused.  Patient states "I'll call back if I decide I want to do any of this."  I again stressed the importance of the virtual visits and that we are here to help her during this time, but she did not agree to having any further interactions with our office at this time.

## 2018-09-25 NOTE — Telephone Encounter (Signed)
Returned call to patient to discuss the importance of virtual visits during the pandemic with Covid-19. She again refused virtual visits.  I also offered to set her up for exercise myoview at [facility] location, but she refused.  Patient states "I'll call back if I decide I want to do any of this."  I again stressed the importance of the virtual visits and that we are here to help her during this time, but she did not agree to having any further interactions with our office at this time.

## 2018-09-30 ENCOUNTER — Ambulatory Visit: Payer: BLUE CROSS/BLUE SHIELD | Admitting: Cardiology

## 2018-10-06 DIAGNOSIS — J3089 Other allergic rhinitis: Secondary | ICD-10-CM | POA: Insufficient documentation

## 2018-10-06 HISTORY — DX: Other allergic rhinitis: J30.89

## 2018-10-29 ENCOUNTER — Telehealth (HOSPITAL_COMMUNITY): Payer: Self-pay | Admitting: *Deleted

## 2018-10-29 NOTE — Telephone Encounter (Signed)
Left message on voicemail per DPR in reference to upcoming appointment scheduled on 11/03/18 at 11:00 with detailed instructions given per Myocardial Perfusion Study Information Sheet for the test. LM to arrive 15 minutes early, and that it is imperative to arrive on time for appointment to keep from having the test rescheduled. If you need to cancel or reschedule your appointment, please call the office within 24 hours of your appointment. Failure to do so may result in a cancellation of your appointment, and a $50 no show fee. Phone number given for call back for any questions.

## 2018-11-03 ENCOUNTER — Other Ambulatory Visit: Payer: Self-pay

## 2018-11-03 ENCOUNTER — Ambulatory Visit (INDEPENDENT_AMBULATORY_CARE_PROVIDER_SITE_OTHER): Payer: BLUE CROSS/BLUE SHIELD

## 2018-11-03 DIAGNOSIS — R079 Chest pain, unspecified: Secondary | ICD-10-CM

## 2018-11-03 LAB — MYOCARDIAL PERFUSION IMAGING
CHL CUP NUCLEAR SDS: 3
CHL CUP RESTING HR STRESS: 54 {beats}/min
CSEPPHR: 87 {beats}/min
LV dias vol: 83 mL (ref 46–106)
LV sys vol: 27 mL
SRS: 3
SSS: 6
TID: 1.2

## 2018-11-03 MED ORDER — REGADENOSON 0.4 MG/5ML IV SOLN
0.4000 mg | Freq: Once | INTRAVENOUS | Status: AC
  Administered 2018-11-03: 0.4 mg via INTRAVENOUS

## 2018-11-03 MED ORDER — TECHNETIUM TC 99M TETROFOSMIN IV KIT
32.3000 | PACK | Freq: Once | INTRAVENOUS | Status: AC | PRN
  Administered 2018-11-03: 32.3 via INTRAVENOUS

## 2018-11-03 MED ORDER — TECHNETIUM TC 99M TETROFOSMIN IV KIT
10.2000 | PACK | Freq: Once | INTRAVENOUS | Status: AC | PRN
  Administered 2018-11-03: 10.2 via INTRAVENOUS

## 2018-11-04 ENCOUNTER — Telehealth: Payer: Self-pay | Admitting: *Deleted

## 2018-11-04 ENCOUNTER — Ambulatory Visit: Payer: BLUE CROSS/BLUE SHIELD

## 2018-11-04 NOTE — Telephone Encounter (Signed)
Telephone call to patient. Informed of Lexiscan results were normal. Confirmed with nuclear that 2nd appointment is canceled and informed patient. No further questions.

## 2018-11-04 NOTE — Telephone Encounter (Signed)
-----   Message from [name], MD sent at 11/03/2018  5:10 PM EDT ----- Regarding: FW: Normal test no ischemia good result ----- Message ----- From: Jenean Lindau, MD Sent: 11/03/2018   2:36 PM EDT To: [name], MD

## 2019-02-16 DIAGNOSIS — Z1211 Encounter for screening for malignant neoplasm of colon: Secondary | ICD-10-CM

## 2019-02-16 HISTORY — DX: Encounter for screening for malignant neoplasm of colon: Z12.11

## 2019-02-22 DIAGNOSIS — G4733 Obstructive sleep apnea (adult) (pediatric): Secondary | ICD-10-CM

## 2019-02-22 DIAGNOSIS — Z23 Encounter for immunization: Secondary | ICD-10-CM | POA: Insufficient documentation

## 2019-02-22 HISTORY — DX: Encounter for immunization: Z23

## 2019-02-22 HISTORY — DX: Obstructive sleep apnea (adult) (pediatric): G47.33

## 2019-05-27 DIAGNOSIS — A46 Erysipelas: Secondary | ICD-10-CM

## 2019-05-27 HISTORY — DX: Erysipelas: A46

## 2019-09-06 ENCOUNTER — Telehealth: Payer: Self-pay | Admitting: Cardiology

## 2019-09-06 NOTE — Telephone Encounter (Signed)
Spoke with patient who states BP has been elevated, diastolic 123XX123 mmHg over the past 3 days. Reports she is compliant with all of her medications, including the 3 doses of hydralazine. She states she has been working out recently and I asked about taking the BP readings too close to the time she exercises. Today, she exercised at 11:45 and at 2:05 BP was 174/89 mmHg; does not know her pulse reading. I asked about diet and she admits to high sodium diet over the past few days including pork skins and fried fish. I contacted the scheduler and she is scheduled to see Dr. Harriet Masson tomorrow in [place]. I advised her to eat a low sodium diet tonight and to stay well hydrated. She verbalized understanding and agreement with plan and thanked me for the call.

## 2019-09-06 NOTE — Telephone Encounter (Signed)
Pt c/o BP issue: STAT if pt c/o blurred vision, one-sided weakness or slurred speech  1. What are your last 5 BP readings?   2. Are you having any other symptoms (ex. Dizziness, headache, blurred vision, passed out)? No  3. What is your BP issue? Patient states her BP has been extremely high, however she states she would rather not list her readings because she does not want to go to the ER. She states she assumes the elevated BP may be due to needing a medication modification. Please advise.

## 2019-09-07 ENCOUNTER — Other Ambulatory Visit: Payer: Self-pay

## 2019-09-07 ENCOUNTER — Ambulatory Visit (INDEPENDENT_AMBULATORY_CARE_PROVIDER_SITE_OTHER): Payer: Medicare Other | Admitting: Cardiology

## 2019-09-07 ENCOUNTER — Encounter (INDEPENDENT_AMBULATORY_CARE_PROVIDER_SITE_OTHER): Payer: Self-pay

## 2019-09-07 ENCOUNTER — Other Ambulatory Visit: Payer: Self-pay | Admitting: Cardiology

## 2019-09-07 ENCOUNTER — Encounter: Payer: Self-pay | Admitting: Cardiology

## 2019-09-07 VITALS — BP 180/94 | HR 93 | Ht 64.5 in | Wt 222.0 lb

## 2019-09-07 DIAGNOSIS — I10 Essential (primary) hypertension: Secondary | ICD-10-CM

## 2019-09-07 DIAGNOSIS — E669 Obesity, unspecified: Secondary | ICD-10-CM | POA: Diagnosis not present

## 2019-09-07 DIAGNOSIS — E785 Hyperlipidemia, unspecified: Secondary | ICD-10-CM

## 2019-09-07 MED ORDER — CARVEDILOL 6.25 MG PO TABS: 6.2500 mg | ORAL_TABLET | Freq: Two times a day (BID) | ORAL | 1 refills | Status: DC

## 2019-09-07 NOTE — Telephone Encounter (Signed)
RX sent to correct pharmacy.  

## 2019-09-07 NOTE — Telephone Encounter (Signed)
*  STAT* If patient is at the pharmacy, call can be transferred to refill team.   1. Which medications need to be refilled? (please list name of each medication and dose if known) carvedilol (COREG) 6.25 MG tablet  2. Which pharmacy/location (including street and city if local pharmacy) is medication to be sent to? [place], [place], [place] [address]  3. Do they need a 30 day or 90 day supply? 90 day  Patient states her new prescription was sent to the wrong pharmacy [facility], instead of [place].

## 2019-09-07 NOTE — Progress Notes (Signed)
Cardiology Office Note:    Date:  09/07/2019   ID:  RUSTY MANSER, DOB Jun 30, 1955, MRN [id number]  PCP:  Myrlene Broker, MD  Cardiologist:  No primary care provider on file.  Electrophysiologist:  None   Referring MD: Myrlene Broker, MD   Chief Complaint  Patient presents with  . Hypertension    History of Present Illness:    Catherine Maxwell is a 64 y.o. female with a hx of hypertension on multiple antihypertensives, diabetes mellitus, obesity presents today to be evaluated due to recurrent elevated blood pressure.  The patient follows with Dr. Bettina Gavia was last seen by him March 24, 2018.  At that time she did have this was thought to be secondary to calcium channel blocker.  Today she requested to be seen as she tells me that she has been experiencing significantly high blood pressure with increasing heart rate is concerned this.  She denies any chest pain, shortness of breath.  Past Medical History:  Diagnosis Date  . Arthritis    R knee, back problem   . Complication of anesthesia    panicky before anesthesia   . Diabetes mellitus    diag. x2 yrs. ago  . Ejection fraction   . GERD (gastroesophageal reflux disease)    recent reflux, took OTC tx.  . Headache(784.0)    uses RX- unsure of name  . Hypertension    stress test- in past recent - [employer]. [place]   . Recurrent upper respiratory infection (URI)    recent cough, treated /w OTC    Past Surgical History:  Procedure Laterality Date  . CESAREAN SECTION     x2  . JOINT REPLACEMENT     2005- L knee, arthroscopic procedure both knees   . SUPERIOR OBLIQUE TUCK     tummy tuck - 1986  . 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 TUBAL LIGATION      Current Medications: Current Meds  Medication Sig  . aspirin EC 81 MG tablet Take 81 mg by mouth daily.  Marland Kitchen atorvastatin  (LIPITOR) 10 MG tablet Take 10 mg by mouth daily.  Marland Kitchen CALCIUM PO Take by mouth daily.  Marland Kitchen CINNAMON PO Take 1 tablet by mouth daily.    . cloNIDine (CATAPRES) 0.3 MG tablet Take 0.3 mg by mouth 2 (two) times daily.  . furosemide (LASIX) 20 MG tablet TAKE ONE TABLET ON MONDAY, WEDNESDAY AND FRIDAY OF EACH WEEK.  Marland Kitchen glipiZIDE (GLUCOTROL) 10 MG tablet Take 10 mg by mouth 2 (two) times daily.  Marland Kitchen glyBURIDE (DIABETA) 5 MG tablet Take 5 mg by mouth 2 (two) times daily as needed.  . hydrALAZINE (APRESOLINE) 50 MG tablet Take 50 mg by mouth 3 (three) times daily.  . irbesartan-hydrochlorothiazide (AVALIDE) 300-12.5 MG tablet Take 1 tablet by mouth daily.  Marland Kitchen JANUVIA 100 MG tablet Take 100 mg by mouth daily.  . meclizine (ANTIVERT) 25 MG tablet Take 1 tablet by mouth 3 (three) times daily as needed.  . methocarbamol (ROBAXIN) 750 MG tablet Take 1 tablet by mouth 3 (three) times daily as needed.  . SYMBICORT 160-4.5 MCG/ACT inhaler Inhale 2 puffs into the lungs 2 (two) times daily.     Allergies:   Celebrex [celecoxib] and Morphine and related   Social History   Socioeconomic History  . Marital status: Married    Spouse  name: Not on file  . Number of children: Not on file  . Years of education: Not on file  . Highest education level: Not on file  Occupational History  . Not on file  Tobacco Use  . Smoking status: Former Smoker    Quit date: 06/03/1979    Years since quitting: 40.2  . Smokeless tobacco: Never Used  Substance and Sexual Activity  . Alcohol use: Yes    Comment: wine occas  . Drug use: No  . Sexual activity: Yes    Birth control/protection: Surgical  Other Topics Concern  . Not on file  Social History Narrative  . Not on file   Social Determinants of Health   Financial Resource Strain:   . Difficulty of Paying Living Expenses:   Food Insecurity:   . Worried About Charity fundraiser in the Last Year:   . Arboriculturist in the Last Year:   Transportation Needs:   .  Film/video editor (Medical):   Marland Kitchen Lack of Transportation (Non-Medical):   Physical Activity:   . Days of Exercise per Week:   . Minutes of Exercise per Session:   Stress:   . Feeling of Stress :   Social Connections:   . Frequency of Communication with Friends and Family:   . Frequency of Social Gatherings with Friends and Family:   . Attends Religious Services:   . Active Member of Clubs or Organizations:   . Attends Archivist Meetings:   Marland Kitchen Marital Status:      Family History: The patient's family history is negative for Anesthesia problems, Hypotension, Malignant hyperthermia, and Pseudochol deficiency.  ROS:   Review of Systems  Constitution: Negative for decreased appetite, fever and weight gain.  HENT: Negative for congestion, ear discharge, hoarse voice and sore throat.   Eyes: Negative for discharge, redness, vision loss in right eye and visual halos.  Cardiovascular: Negative for chest pain, dyspnea on exertion, leg swelling, orthopnea and palpitations.  Respiratory: Negative for cough, hemoptysis, shortness of breath and snoring.   Endocrine: Negative for heat intolerance and polyphagia.  Hematologic/Lymphatic: Negative for bleeding problem. Does not bruise/bleed easily.  Skin: Negative for flushing, nail changes, rash and suspicious lesions.  Musculoskeletal: Negative for arthritis, joint pain, muscle cramps, myalgias, neck pain and stiffness.  Gastrointestinal: Negative for abdominal pain, bowel incontinence, diarrhea and excessive appetite.  Genitourinary: Negative for decreased libido, genital sores and incomplete emptying.  Neurological: Negative for brief paralysis, focal weakness, headaches and loss of balance.  Psychiatric/Behavioral: Negative for altered mental status, depression and suicidal ideas.  Allergic/Immunologic: Negative for HIV exposure and persistent infections.    EKGs/Labs/Other Studies Reviewed:    The following studies were  reviewed today:   EKG:  None tooday  Pharmalogical stress test  The left ventricular ejection fraction is hyperdynamic (>65%).  Nuclear stress EF: 68%.  There was no ST segment deviation noted during stress.  The study is normal.  This is a low risk study.  Apical thinning     Recent Labs: No results found for requested labs within last 8760 hours.  Recent Lipid Panel No results found for: CHOL, TRIG, HDL, CHOLHDL, VLDL, LDLCALC, LDLDIRECT  Physical Exam:    VS:  BP (!) 180/94 (BP Location: Left Arm, Patient Position: Sitting, Cuff Size: Large)   Pulse 93   Ht 5' 4.5" (1.638 m)   Wt 222 lb (100.7 kg)   SpO2 94%   BMI 37.52 kg/m  Wt Readings from Last 3 Encounters:  09/07/19 222 lb (100.7 kg)  11/03/18 231 lb (104.8 kg)  03/24/18 231 lb 6.4 oz (105 kg)     GEN: Well nourished, well developed in no acute distress HEENT: Normal NECK: No JVD; No carotid bruits LYMPHATICS: No lymphadenopathy CARDIAC: S1S2 noted,RRR, no murmurs, rubs, gallops RESPIRATORY:  Clear to auscultation without rales, wheezing or rhonchi  ABDOMEN: Soft, non-tender, non-distended, +bowel sounds, no guarding. EXTREMITIES: No edema, No cyanosis, no clubbing MUSCULOSKELETAL:  No deformity  SKIN: Warm and dry NEUROLOGIC:  Alert and oriented x 3, non-focal PSYCHIATRIC:  Normal affect, good insight  ASSESSMENT:    1. Essential hypertension   2. Hyperlipidemia, unspecified hyperlipidemia type   3. Obesity (BMI 30-39.9)    PLAN:     1.  The patient did bring her home blood pressure recording appears to be an average systolic blood pressure between [id number] mmhg and diastolic appears to be 123456 mmHg with HR mostly in the low 100s. She admits to compliance with her medication but has not been really paying attention to the quantity of salt in her food/diet.  I reviewed her medications she is currently on Lasix 20 mg Monday Wednesday Fridays, clonidine 0.3 mg twice daily, hydralazine 50 mg 3  times a day, irbesartan 300 mg daily hydrochlorothiazide 12.5 mg daily.  At this time I would like to add carvedilol 6.25 mg twice a day to current medication she has had beta-blockers in the past she denies any allergies to this medications and has not had any significant side effect.  She is agreeable to start this medication.  I have asked the patient to pain attention to hopefully decrease the salt from the diet and that she could increase potassium. She will take her blood pressure daily and will see her back in follow-up in 4 weeks.  2.  Hyperlipidemia-continue patient atorvastatin.  3.  Obesity-the patient understands the need to lose weight with diet and exercise. We have discussed specific strategies for this.  The patient is in agreement with the above plan. The patient left the office in stable condition.  The patient will follow up in 1 month with Dr. Bettina Gavia.   Medication Adjustments/Labs and Tests Ordered: Current medicines are reviewed at length with the patient today.  Concerns regarding medicines are outlined above.  No orders of the defined types were placed in this encounter.  Meds ordered this encounter  Medications  . carvedilol (COREG) 6.25 MG tablet    Sig: Take 1 tablet (6.25 mg total) by mouth 2 (two) times daily.    Dispense:  60 tablet    Refill:  1    Patient Instructions  Medication Instructions:  Your physician has recommended you make the following change in your medication:   START: Coreg 6.25 mg twice daily   *If you need a refill on your cardiac medications before your next appointment, please call your pharmacy*   Lab Work: None.  If you have labs (blood work) drawn today and your tests are completely normal, you will receive your results only by: Marland Kitchen MyChart Message (if you have MyChart) OR . A paper copy in the mail If you have any lab test that is abnormal or we need to change your treatment, we will call you to review the  results.   Testing/Procedures: None.    Follow-Up: At [facility], you and your health needs are our priority.  As part of our continuing mission to provide you with exceptional  heart care, we have created designated Provider Care Teams.  These Care Teams include your primary Cardiologist (physician) and Advanced Practice Providers (APPs -  Physician Assistants and Nurse Practitioners) who all work together to provide you with the care you need, when you need it.  We recommend signing up for the patient portal called "MyChart".  Sign up information is provided on this After Visit Summary.  MyChart is used to connect with patients for Virtual Visits (Telemedicine).  Patients are able to view lab/test results, encounter notes, upcoming appointments, etc.  Non-urgent messages can be sent to your provider as well.   To learn more about what you can do with MyChart, go to [email or web address].    Your next appointment:   4 week(s)  The format for your next appointment:   In Person  Provider:   Berniece Salines, DO   Other Instructions  Carvedilol Tablets What is this medicine? CARVEDILOL (KAR ve dil ol) is a beta blocker. It decreases the amount of work your heart has to do and helps your heart beat regularly. It treats high blood pressure. This medicine may be used for other purposes; ask your health care provider or pharmacist if you have questions. COMMON BRAND NAME(S): Coreg What should I tell my health care provider before I take this medicine? They need to know if you have any of these conditions:  circulation problems  diabetes  history of heart attack or heart disease  liver disease  lung or breathing disease, like asthma or emphysema  pheochromocytoma  slow or irregular heartbeat  thyroid disease  an unusual or allergic reaction to carvedilol, other beta-blockers, medicines, foods, dyes, or preservatives  pregnant or trying to get  pregnant  breast-feeding How should I use this medicine? Take this drug by mouth. Take it as directed on the prescription label at the same time every day. Take it with food. Keep taking it unless your health care provider tells you to stop. Talk to your health care provider about the use of this drug in children. Special care may be needed. Overdosage: If you think you have taken too much of this medicine contact a poison control center or emergency room at once. NOTE: This medicine is only for you. Do not share this medicine with others. What if I miss a dose? If you miss a dose, take it as soon as you can. If it is almost time for your next dose, take only that dose. Do not take double or extra doses. What may interact with this medicine? This medicine may interact with the following medications:  certain medicines for blood pressure, heart disease, irregular heart beat  certain medicines for depression, like fluoxetine or paroxetine  certain medicines for diabetes, like glipizide or glyburide  cimetidine  clonidine  cyclosporine  digoxin  MAOIs like Carbex, Eldepryl, Marplan, Nardil, and Parnate  reserpine  rifampin This list may not describe all possible interactions. Give your health care provider a list of all the medicines, herbs, non-prescription drugs, or dietary supplements you use. Also tell them if you smoke, drink alcohol, or use illegal drugs. Some items may interact with your medicine. What should I watch for while using this medicine? Check your heart rate and blood pressure regularly while you are taking this medicine. Ask your doctor or health care professional what your heart rate and blood pressure should be, and when you should contact him or her. Do not stop taking this medicine suddenly. This could lead to serious heart-related  effects. Contact your doctor or health care professional if you have difficulty breathing while taking this drug. Check your weight  daily. Ask your doctor or health care professional when you should notify him/her of any weight gain. You may get drowsy or dizzy. Do not drive, use machinery, or do anything that requires mental alertness until you know how this medicine affects you. To reduce the risk of dizzy or fainting spells, do not sit or stand up quickly. Alcohol can make you more drowsy, and increase flushing and rapid heartbeats. Avoid alcoholic drinks. This medicine may increase blood sugar. Ask your healthcare provider if changes in diet or medicines are needed if you have diabetes. If you are going to have surgery, tell your doctor or health care professional that you are taking this medicine. What side effects may I notice from receiving this medicine? Side effects that you should report to your doctor or health care professional as soon as possible:  allergic reactions like skin rash, itching or hives, swelling of the face, lips, or tongue  breathing problems  dark urine  irregular heartbeat   signs and symptoms of high blood sugar such as being more thirsty or hungry or having to urinate more than normal. You may also feel very tired or have blurry vision.  swollen legs or ankles  vomiting  yellowing of the eyes or skin Side effects that usually do not require medical attention (report to your doctor or health care professional if they continue or are bothersome):  change in sex drive or performance  diarrhea  dry eyes (especially if wearing contact lenses)  dry, itching skin  headache  nausea  unusually tired This list may not describe all possible side effects. Call your doctor for medical advice about side effects. You may report side effects to FDA at 1-800-FDA-1088. Where should I keep my medicine? Keep out of the reach of children and pets. Store at room temperature between 20 and 25 degrees C (68 and 77 degrees F). Protect from moisture. Keep the container tightly closed. Throw away any  unused drug after the expiration date. NOTE: This sheet is a summary. It may not cover all possible information. If you have questions about this medicine, talk to your doctor, pharmacist, or health care provider.  2020 Elsevier/Gold Standard (2019-01-15 17:42:09)      Adopting a Healthy Lifestyle.  Know what a healthy weight is for you (roughly BMI <25) and aim to maintain this   Aim for 7+ servings of fruits and vegetables daily   65-80+ fluid ounces of water or unsweet tea for healthy kidneys   Limit to max 1 drink of alcohol per day; avoid smoking/tobacco   Limit animal fats in diet for cholesterol and heart health - choose grass fed whenever available   Avoid highly processed foods, and foods high in saturated/trans fats   Aim for low stress - take time to unwind and care for your mental health   Aim for 150 min of moderate intensity exercise weekly for heart health, and weights twice weekly for bone health   Aim for 7-9 hours of sleep daily   When it comes to diets, agreement about the perfect plan isnt easy to find, even among the experts. Experts at the [employer] developed an idea known as the Healthy Eating Plate. Just imagine a plate divided into logical, healthy portions.   The emphasis is on diet quality:   Load up on vegetables and fruits -  one-half of your plate: Aim for color and variety, and remember that potatoes dont count.   Go for whole grains - one-quarter of your plate: Whole wheat, barley, wheat berries, quinoa, oats, brown rice, and foods made with them. If you want pasta, go with whole wheat pasta.   Protein power - one-quarter of your plate: Fish, chicken, beans, and nuts are all healthy, versatile protein sources. Limit red meat.   The diet, however, does go beyond the plate, offering a few other suggestions.   Use healthy plant oils, such as olive, canola, soy, corn, sunflower and peanut. Check the labels, and avoid  partially hydrogenated oil, which have unhealthy trans fats.   If youre thirsty, drink water. Coffee and tea are good in moderation, but skip sugary drinks and limit milk and dairy products to one or two daily servings.   The type of carbohydrate in the diet is more important than the amount. Some sources of carbohydrates, such as vegetables, fruits, whole grains, and beans-are healthier than others.   Finally, stay active  Signed, Berniece Salines, DO  09/07/2019 9:18 AM    [place]

## 2019-09-07 NOTE — Telephone Encounter (Signed)
*  STAT* If patient is at the pharmacy, call can be transferred to refill team.   1. Which medications need to be refilled? (please list name of each medication and dose if known) carvedilol (COREG) 6.25 MG tablet  2. Which pharmacy/location (including street and city if local pharmacy) is medication to be sent to? [address], [place], [place] [address]   3. Do they need a 30 day or 90 day supply? 90   Prescription was sent to [facility] but patient needs it send to [facility].

## 2019-09-07 NOTE — Patient Instructions (Signed)
Medication Instructions:  Your physician has recommended you make the following change in your medication:   START: Coreg 6.25 mg twice daily   *If you need a refill on your cardiac medications before your next appointment, please call your pharmacy*   Lab Work: None.  If you have labs (blood work) drawn today and your tests are completely normal, you will receive your results only by: Marland Kitchen MyChart Message (if you have MyChart) OR . A paper copy in the mail If you have any lab test that is abnormal or we need to change your treatment, we will call you to review the results.   Testing/Procedures: None.    Follow-Up: At [facility], you and your health needs are our priority.  As part of our continuing mission to provide you with exceptional heart care, we have created designated Provider Care Teams.  These Care Teams include your primary Cardiologist (physician) and Advanced Practice Providers (APPs -  Physician Assistants and Nurse Practitioners) who all work together to provide you with the care you need, when you need it.  We recommend signing up for the patient portal called "MyChart".  Sign up information is provided on this After Visit Summary.  MyChart is used to connect with patients for Virtual Visits (Telemedicine).  Patients are able to view lab/test results, encounter notes, upcoming appointments, etc.  Non-urgent messages can be sent to your provider as well.   To learn more about what you can do with MyChart, go to [email or web address].    Your next appointment:   4 week(s)  The format for your next appointment:   In Person  Provider:   Berniece Salines, DO   Other Instructions  Carvedilol Tablets What is this medicine? CARVEDILOL (KAR ve dil ol) is a beta blocker. It decreases the amount of work your heart has to do and helps your heart beat regularly. It treats high blood pressure. This medicine may be used for other purposes; ask your health care provider or  pharmacist if you have questions. COMMON BRAND NAME(S): Coreg What should I tell my health care provider before I take this medicine? They need to know if you have any of these conditions:  circulation problems  diabetes  history of heart attack or heart disease  liver disease  lung or breathing disease, like asthma or emphysema  pheochromocytoma  slow or irregular heartbeat  thyroid disease  an unusual or allergic reaction to carvedilol, other beta-blockers, medicines, foods, dyes, or preservatives  pregnant or trying to get pregnant  breast-feeding How should I use this medicine? Take this drug by mouth. Take it as directed on the prescription label at the same time every day. Take it with food. Keep taking it unless your health care provider tells you to stop. Talk to your health care provider about the use of this drug in children. Special care may be needed. Overdosage: If you think you have taken too much of this medicine contact a poison control center or emergency room at once. NOTE: This medicine is only for you. Do not share this medicine with others. What if I miss a dose? If you miss a dose, take it as soon as you can. If it is almost time for your next dose, take only that dose. Do not take double or extra doses. What may interact with this medicine? This medicine may interact with the following medications:  certain medicines for blood pressure, heart disease, irregular heart beat  certain medicines for depression, like  fluoxetine or paroxetine  certain medicines for diabetes, like glipizide or glyburide  cimetidine  clonidine  cyclosporine  digoxin  MAOIs like Carbex, Eldepryl, Marplan, Nardil, and Parnate  reserpine  rifampin This list may not describe all possible interactions. Give your health care provider a list of all the medicines, herbs, non-prescription drugs, or dietary supplements you use. Also tell them if you smoke, drink alcohol, or  use illegal drugs. Some items may interact with your medicine. What should I watch for while using this medicine? Check your heart rate and blood pressure regularly while you are taking this medicine. Ask your doctor or health care professional what your heart rate and blood pressure should be, and when you should contact him or her. Do not stop taking this medicine suddenly. This could lead to serious heart-related effects. Contact your doctor or health care professional if you have difficulty breathing while taking this drug. Check your weight daily. Ask your doctor or health care professional when you should notify him/her of any weight gain. You may get drowsy or dizzy. Do not drive, use machinery, or do anything that requires mental alertness until you know how this medicine affects you. To reduce the risk of dizzy or fainting spells, do not sit or stand up quickly. Alcohol can make you more drowsy, and increase flushing and rapid heartbeats. Avoid alcoholic drinks. This medicine may increase blood sugar. Ask your healthcare provider if changes in diet or medicines are needed if you have diabetes. If you are going to have surgery, tell your doctor or health care professional that you are taking this medicine. What side effects may I notice from receiving this medicine? Side effects that you should report to your doctor or health care professional as soon as possible:  allergic reactions like skin rash, itching or hives, swelling of the face, lips, or tongue  breathing problems  dark urine  irregular heartbeat   signs and symptoms of high blood sugar such as being more thirsty or hungry or having to urinate more than normal. You may also feel very tired or have blurry vision.  swollen legs or ankles  vomiting  yellowing of the eyes or skin Side effects that usually do not require medical attention (report to your doctor or health care professional if they continue or are  bothersome):  change in sex drive or performance  diarrhea  dry eyes (especially if wearing contact lenses)  dry, itching skin  headache  nausea  unusually tired This list may not describe all possible side effects. Call your doctor for medical advice about side effects. You may report side effects to FDA at 1-800-FDA-1088. Where should I keep my medicine? Keep out of the reach of children and pets. Store at room temperature between 20 and 25 degrees C (68 and 77 degrees F). Protect from moisture. Keep the container tightly closed. Throw away any unused drug after the expiration date. NOTE: This sheet is a summary. It may not cover all possible information. If you have questions about this medicine, talk to your doctor, pharmacist, or health care provider.  2020 Elsevier/Gold Standard (2019-01-15 17:42:09)

## 2019-09-07 NOTE — Telephone Encounter (Signed)
90 day supply sent in 

## 2019-09-08 ENCOUNTER — Encounter: Payer: Self-pay | Admitting: Cardiology

## 2019-09-08 ENCOUNTER — Telehealth: Payer: Self-pay | Admitting: Cardiology

## 2019-09-08 MED ORDER — CARVEDILOL 6.25 MG PO TABS: 6.2500 mg | ORAL_TABLET | Freq: Two times a day (BID) | ORAL | 3 refills | Status: DC

## 2019-09-08 NOTE — Telephone Encounter (Signed)
error 

## 2019-09-08 NOTE — Telephone Encounter (Signed)
*  STAT* If patient is at the pharmacy, call can be transferred to refill team.   1. Which medications need to be refilled? (please list name of each medication and dose if known)  carvedilol (COREG) 6.25 MG tablet  2. Which pharmacy/location (including street and city if local pharmacy) is medication to be sent to?  [facility] [place], [place] (Ph: [phone])  3. Do they need a 30 day or 90 day supply? 90  Patient called and said this rx was initially sent to [facility] to be filled, but it actually needed to go to [facility]. When the patient contacted [facility] to get the issue resolved, [facility] had already filled the rx and started processing it, so [facility] could not bill the insurance. The patient was able to cancel the rx with [facility], so now she will need a brand new RX sent to [facility] to be filled.

## 2019-09-27 DIAGNOSIS — I10 Essential (primary) hypertension: Secondary | ICD-10-CM | POA: Insufficient documentation

## 2019-09-27 DIAGNOSIS — N952 Postmenopausal atrophic vaginitis: Secondary | ICD-10-CM

## 2019-09-27 HISTORY — DX: Essential (primary) hypertension: I10

## 2019-09-27 HISTORY — DX: Postmenopausal atrophic vaginitis: N95.2

## 2019-09-27 NOTE — Progress Notes (Signed)
Cardiology Office Note:    Date:  09/28/2019   ID:  Catherine Maxwell, DOB 1955-09-15, MRN [id number]  PCP:  Myrlene Broker, MD  Cardiologist:  Shirlee More, MD    Referring MD: Myrlene Broker, MD    ASSESSMENT:    1. Moderate persistent asthma with acute exacerbation   2. Hypertensive heart disease without heart failure   3. Type 2 diabetes mellitus with complication, without long-term current use of insulin (HCC)    PLAN:    In order of problems listed above:  1. Today's problem is clearly an exacerbation of asthma I renewed her bronchodilator gave her prescription for short course of prednisone and will stop her beta-blocker.  Worsen present to the emergency room 2. Poorly controlled secondary to fluid overload she will increase her loop diuretic and if clinically required additional therapy 3. Will managed by her PCP  During the visit I asked if she felt she needed to go to the emergency room she told me know I told her if she does not improve or worsen present to the emergency room tonight with her exacerbation of asthma strongly encouraged her to fill the prescription and take the steroids.  Facilitate her care I renewed her bronchodilator  Next appointment: 1 week   Medication Adjustments/Labs and Tests Ordered: Current medicines are reviewed at length with the patient today.  Concerns regarding medicines are outlined above.  No orders of the defined types were placed in this encounter.  No orders of the defined types were placed in this encounter.   Chief Complaint  Patient presents with  . Follow-up  . Hypertension    History of Present Illness:    Catherine Maxwell is a 64 y.o. female with a hx of T2 DM hypertension hyperlipidemia type 2 diabetes and sinus bradycardia  last seen 03/24/2018 with uncontrolled hypertension was placed on carvedilol. Compliance with diet, lifestyle and medications: Yes  In the past she has had severe bronchospasm her  beta-blocker symptoms have been the same she sat up last evening she is short of breath at rest coughing and wheezing and has increased peripheral edema.  I think that there is an element of heart failure I will have her take 40 mg of furosemide daily then 20 twice daily to see in the office check renal function proBNP and if worsen present to the ER I renewedher  bronchodilator and gave her a prescription for a short course of prednisone she is taking in the past and she will use her judgment whether to fill it today after taking furosemide.  No chest pain palpitation or syncope Past Medical History:  Diagnosis Date  . Acute lymphadenitis 12/16/2016  . Arthritis    R knee, back problem   . Atrophic vaginitis 09/27/2019  . Benign essential hypertension 12/16/2016   Formatting of this note might be different from the original. followed by cardiology,  stable on current meds,  continue DASH, ADA diet Formatting of this note might be different from the original. Overview:  stress test- in past recent - [employer]. [place]   //   renal artery Doppler May, 2015, normal renal arteries, no renal artery stenosis, aorta and iliacs normal.  Last Assessment & Pla  . Bradycardia, sinus 10/27/2013   Sinus bradycardia, EKG, Oct 27, 2013   . Cervical radiculopathy 05/16/2017  . Complication of anesthesia    panicky before anesthesia   . Congestion of upper airway 05/16/2017  . Diabetes ([place]) 10/26/2013  History of diabetes, type II, no mention of complication   . Diabetes mellitus    diag. x2 yrs. ago  . Ejection fraction   . Erysipelas 05/27/2019  . Essential hypertension    stress test- in past recent - [employer]. [place]   //   renal artery Doppler May, 2015, normal renal arteries, no renal artery stenosis, aorta and iliacs normal.   . GERD (gastroesophageal reflux disease)    recent reflux, took OTC tx.  . Hair loss 12/16/2016  . Headache(784.0)    uses RX- unsure of name  . Hematuria 12/16/2016   . History of migraine 12/16/2016  . Hypertension    stress test- in past recent - [employer]. [place]   . Hypertensive disorder 09/27/2019  . Idiopathic peripheral neuropathy 01/10/2017  . Knee pain, left anterior 12/16/2016   [facility]  . Myalgia and myositis 12/16/2016  . Obesity 12/16/2016  . OSA (obstructive sleep apnea) 02/22/2019  . Other constipation 12/16/2016  . Pelvic pain in female 12/16/2016  . Recurrent upper respiratory infection (URI)    recent cough, treated /w OTC  . S/P lumbar laminectomy 11/12/2017  . Screening for colon cancer 02/16/2019  . Sinusitis 12/16/2016  . Status post bilateral knee replacements 09/17/2016  . Subdeltoid bursitis, left 12/16/2016  . Trigger finger of both hands 12/16/2016  . Trigger finger of right thumb 08/14/2017  . Trigger middle finger of right hand 08/14/2017  . Type 2 diabetes mellitus with hyperglycemia, without long-term current use of insulin ([place]) 12/16/2016   Formatting of this note might be different from the original. at goal  . Vaginitis and vulvovaginitis 12/16/2016  . Vertigo 12/16/2016   RH PT evaluate and treat  . Vitamin D deficiency 12/16/2016  . Wheezing 12/16/2016  . Wheezy bronchitis 12/16/2016  . Yeast infection of the skin 03/07/2017    Past Surgical History:  Procedure Laterality Date  . CESAREAN SECTION     x2  . JOINT REPLACEMENT     2005- L knee, arthroscopic procedure both knees   . SUPERIOR OBLIQUE TUCK     tummy tuck - 1986  . TOTAL KNEE ARTHROPLASTY  06/10/2011   Procedure: TOTAL KNEE ARTHROPLASTY;  Surgeon: Rudean Haskell, MD;  Location: [place];  Service: Orthopedics;  Laterality: Right;  . TUBAL LIGATION      Current Medications: Current Meds  Medication Sig  . aspirin EC 81 MG tablet Take 81 mg by mouth daily.  Marland Kitchen atorvastatin (LIPITOR) 20 MG tablet Take 20 mg by mouth daily.  Marland Kitchen CALCIUM PO Take by mouth daily.  Marland Kitchen CINNAMON PO Take 1 tablet by mouth daily.     . cloNIDine (CATAPRES) 0.3 MG tablet Take 0.3 mg by mouth 2 (two) times daily.  Marland Kitchen glyBURIDE (DIABETA) 5 MG tablet Take 5 mg by mouth 2 (two) times daily as needed.  . hydrALAZINE (APRESOLINE) 50 MG tablet Take 50 mg by mouth 3 (three) times daily.  . irbesartan-hydrochlorothiazide (AVALIDE) 300-12.5 MG tablet Take 1 tablet by mouth daily.  Marland Kitchen JANUVIA 100 MG tablet Take 100 mg by mouth daily.  . [DISCONTINUED] carvedilol (COREG) 6.25 MG tablet Take 1 tablet (6.25 mg total) by mouth 2 (two) times daily.  . [DISCONTINUED] furosemide (LASIX) 20 MG tablet TAKE ONE TABLET ON MONDAY, WEDNESDAY AND FRIDAY OF EACH WEEK.     Allergies:   Celebrex [celecoxib] and Morphine and related   Social History   Socioeconomic History  .  Marital status: Married    Spouse name: Not on file  . Number of children: Not on file  . Years of education: Not on file  . Highest education level: Not on file  Occupational History  . Not on file  Tobacco Use  . Smoking status: Former Smoker    Quit date: 06/03/1979    Years since quitting: 40.3  . Smokeless tobacco: Never Used  Substance and Sexual Activity  . Alcohol use: Yes    Comment: wine occas  . Drug use: No  . Sexual activity: Yes    Birth control/protection: Surgical  Other Topics Concern  . Not on file  Social History Narrative  . Not on file   Social Determinants of Health   Financial Resource Strain:   . Difficulty of Paying Living Expenses:   Food Insecurity:   . Worried About Charity fundraiser in the Last Year:   . Arboriculturist in the Last Year:   Transportation Needs:   . Film/video editor (Medical):   Marland Kitchen Lack of Transportation (Non-Medical):   Physical Activity:   . Days of Exercise per Week:   . Minutes of Exercise per Session:   Stress:   . Feeling of Stress :   Social Connections:   . Frequency of Communication with Friends and Family:   . Frequency of Social Gatherings with Friends and Family:   . Attends Religious  Services:   . Active Member of Clubs or Organizations:   . Attends Archivist Meetings:   Marland Kitchen Marital Status:      Family History: The patient's family history is negative for Anesthesia problems, Hypotension, Malignant hyperthermia, and Pseudochol deficiency. ROS:   Please see the history of present illness.    All other systems reviewed and are negative.  EKGs/Labs/Other Studies Reviewed:    The following studies were reviewed today:    Recent Labs: Last labs available are from September 2019 creatinine 0.74 02/16/2019 cholesterol 159 LDL 98 HDL 54 Potassium 3.9 creatinine 0.75 GFR greater than 90 cc Physical Exam:    VS:  BP (!) 178/88   Pulse 66   Temp 97.6 F (36.4 C)   Ht 5' 4.5" (1.638 m)   Wt 220 lb 6.4 oz (100 kg)   SpO2 95%   BMI 37.25 kg/m     Wt Readings from Last 3 Encounters:  09/28/19 220 lb 6.4 oz (100 kg)  09/07/19 222 lb (100.7 kg)  11/03/18 231 lb (104.8 kg)     GEN: She is apprehensive anxious and breathless but not in distress well nourished, well developed in no acute distress HEENT: Normal NECK: No JVD; No carotid bruits LYMPHATICS: No lymphadenopathy CARDIAC: RRR, no murmurs, rubs, gallops RESPIRATORY: Breath sounds prolonged expiration and wheezing ABDOMEN: Soft, non-tender, non-distended MUSCULOSKELETAL: 2+ bilateral pitting to the knee edema; No deformity  SKIN: Warm and dry NEUROLOGIC:  Alert and oriented x 3 PSYCHIATRIC:  Normal affect    Signed, Shirlee More, MD  09/28/2019 10:20 AM    [place]

## 2019-09-28 ENCOUNTER — Ambulatory Visit (INDEPENDENT_AMBULATORY_CARE_PROVIDER_SITE_OTHER): Payer: Medicare Other | Admitting: Cardiology

## 2019-09-28 ENCOUNTER — Encounter: Payer: Self-pay | Admitting: Cardiology

## 2019-09-28 ENCOUNTER — Other Ambulatory Visit: Payer: Self-pay

## 2019-09-28 VITALS — BP 178/88 | HR 66 | Temp 97.6°F | Ht 64.5 in | Wt 220.4 lb

## 2019-09-28 DIAGNOSIS — I119 Hypertensive heart disease without heart failure: Secondary | ICD-10-CM

## 2019-09-28 DIAGNOSIS — E118 Type 2 diabetes mellitus with unspecified complications: Secondary | ICD-10-CM | POA: Diagnosis not present

## 2019-09-28 DIAGNOSIS — J4541 Moderate persistent asthma with (acute) exacerbation: Secondary | ICD-10-CM | POA: Diagnosis not present

## 2019-09-28 MED ORDER — PREDNISONE 20 MG PO TABS: ORAL_TABLET | ORAL | 0 refills | Status: DC

## 2019-09-28 MED ORDER — FUROSEMIDE 20 MG PO TABS: 20.0000 mg | ORAL_TABLET | Freq: Two times a day (BID) | ORAL | 3 refills | Status: DC

## 2019-09-28 MED ORDER — ALBUTEROL SULFATE HFA 108 (90 BASE) MCG/ACT IN AERS: 2.0000 | INHALATION_SPRAY | RESPIRATORY_TRACT | 2 refills | Status: DC | PRN

## 2019-09-28 NOTE — Patient Instructions (Signed)
Medication Instructions:  Your physician has recommended you make the following change in your medication:  STOP Carvedilol START Proair inhaler take two puffs every 4-6 hours as needed for wheezing or shortness of breath. START Lasix 20 mg take one tablet by mouth twice daily. Please take two tablets today as a single dose. START Prednisone 20 mg. Take two tablets by mouth daily for three days, then one tablet by mouth daily for three days, then 0.5 tablets by mouth daily for three days.  *If you need a refill on your cardiac medications before your next appointment, please call your pharmacy*   Lab Work: Your physician recommends that you return for lab work in: TODAY BMP, ProBNP If you have labs (blood work) drawn today and your tests are completely normal, you will receive your results only by: Marland Kitchen MyChart Message (if you have MyChart) OR . A paper copy in the mail If you have any lab test that is abnormal or we need to change your treatment, we will call you to review the results.   Testing/Procedures: None   Follow-Up: At [facility], you and your health needs are our priority.  As part of our continuing mission to provide you with exceptional heart care, we have created designated Provider Care Teams.  These Care Teams include your primary Cardiologist (physician) and Advanced Practice Providers (APPs -  Physician Assistants and Nurse Practitioners) who all work together to provide you with the care you need, when you need it.  We recommend signing up for the patient portal called "MyChart".  Sign up information is provided on this After Visit Summary.  MyChart is used to connect with patients for Virtual Visits (Telemedicine).  Patients are able to view lab/test results, encounter notes, upcoming appointments, etc.  Non-urgent messages can be sent to your provider as well.   To learn more about what you can do with MyChart, go to [email or web address].    Your next  appointment:   1 week(s)  The format for your next appointment:   In Person  Provider:   Shirlee More, MD   Other Instructions

## 2019-09-29 ENCOUNTER — Telehealth: Payer: Self-pay

## 2019-09-29 LAB — PRO B NATRIURETIC PEPTIDE: NT-Pro BNP: 181 pg/mL (ref 0–287)

## 2019-09-29 LAB — BASIC METABOLIC PANEL
BUN/Creatinine Ratio: 14 (ref 12–28)
BUN: 10 mg/dL (ref 8–27)
CO2: 29 mmol/L (ref 20–29)
Calcium: 9.7 mg/dL (ref 8.7–10.3)
Chloride: 101 mmol/L (ref 96–106)
Creatinine, Ser: 0.69 mg/dL (ref 0.57–1.00)
GFR calc Af Amer: 107 mL/min/{1.73_m2} (ref >59)
GFR calc non Af Amer: 93 mL/min/{1.73_m2} (ref >59)
Glucose: 173 mg/dL — ABNORMAL HIGH (ref 65–99)
Potassium: 4 mmol/L (ref 3.5–5.2)
Sodium: 145 mmol/L — ABNORMAL HIGH (ref 134–144)

## 2019-09-29 NOTE — Telephone Encounter (Signed)
LMTCB with any questions

## 2019-09-29 NOTE — Telephone Encounter (Signed)
-----   Message from [name], MD sent at 09/29/2019  8:11 AM EDT ----- Normal or stable result  No changes

## 2019-09-30 ENCOUNTER — Telehealth: Payer: Self-pay

## 2019-09-30 NOTE — Telephone Encounter (Signed)
Lpm with results 4/8

## 2019-09-30 NOTE — Telephone Encounter (Signed)
-----   Message from [name], MD sent at 09/29/2019  8:11 AM EDT ----- Normal or stable result  No changes

## 2019-10-01 ENCOUNTER — Telehealth: Payer: Self-pay

## 2019-10-01 NOTE — Telephone Encounter (Signed)
Spoke with patient regarding recommendations.  Patient verbalizes understanding and is agreeable to plan of care. Advised patient to call back with any issues or concerns.  

## 2019-10-05 ENCOUNTER — Ambulatory Visit: Payer: Medicare Other | Admitting: Cardiology

## 2019-10-05 NOTE — Progress Notes (Signed)
Cardiology Office Note:    Date:  10/06/2019   ID:  Catherine Maxwell, DOB Nov 03, 1955, MRN [id number]  PCP:  Myrlene Broker, MD  Cardiologist:  Shirlee More, MD    Referring MD: Myrlene Broker, MD    ASSESSMENT:    1. Hypertensive heart disease without heart failure   2. Moderate persistent asthma with acute exacerbation    PLAN:    In order of problems listed above:  1. Marked improvement asthma resolved she is taking her last dose of prednisone today and will use her bronchodilator as needed.  She will continue her loop diuretic BP is very close to target we will add a low-dose of MRA recheck renal function proBNP and reassess in 6 weeks and try to streamline her medications.  I told her that she can never take a beta-blocker again.   Next appointment: 6 weeks   Medication Adjustments/Labs and Tests Ordered: Current medicines are reviewed at length with the patient today.  Concerns regarding medicines are outlined above.  No orders of the defined types were placed in this encounter.  No orders of the defined types were placed in this encounter.   Chief Complaint  Patient presents with   Follow-up    1 week after beta-blocker discontinued with marked bronchospasm and acute exacerbation of asthma as well as   Congestive Heart Failure   Hypertension    History of Present Illness:    Catherine Maxwell is a 64 y.o. female with a hx of type 2 diabetes hypertension with heart failure hyperlipidemia and sinus bradycardia.  She was last seen 09/28/2019 at that time the predominant problem was acute exacerbation of asthma.. Compliance with diet, lifestyle and medications: Yes  She is remarkably improved she told me by the evening after taking her diuretic and steroids and restarting her bronchodilator that her breathing had improved.  I truly had worried that she be admitted to the hospital.  I told her she can never take a beta-blocker again.  Her weight is down 6 pounds  her edema is resolved and repeat blood pressure by me is 148/86.  Despite the low BNP level I think she has a diastolic heart failure phenotype.  She did not continue the loop diuretic avoid beta-blockers unclear on a low-dose of MRA and when I see her next visit if doing well will discontinue her thiazide diuretic with her diuresis recheck renal function and proBNP level.  She is having no wheezing shortness of breath edema orthopnea chest pain palpitation or syncope. Past Medical History:  Diagnosis Date   Acute lymphadenitis 12/16/2016   Arthritis    R knee, back problem    Atrophic vaginitis 09/27/2019   Benign essential hypertension 12/16/2016   Formatting of this note might be different from the original. followed by cardiology,  stable on current meds,  continue DASH, ADA diet Formatting of this note might be different from the original. Overview:  stress test- in past recent - [employer]. [place]   //   renal artery Doppler May, 2015, normal renal arteries, no renal artery stenosis, aorta and iliacs normal.  Last Assessment & Pla   Bradycardia, sinus 10/27/2013   Sinus bradycardia, EKG, Oct 27, 2013    Cervical radiculopathy [id number]   Complication of anesthesia    panicky before anesthesia    Congestion of upper airway 05/16/2017   Diabetes ([place]) 10/26/2013   History of diabetes, type II, no mention of complication    Diabetes mellitus  diag. x2 yrs. ago   Ejection fraction    Erysipelas 05/27/2019   Essential hypertension    stress test- in past recent - [employer]. [place]   //   renal artery Doppler May, 2015, normal renal arteries, no renal artery stenosis, aorta and iliacs normal.    GERD (gastroesophageal reflux disease)    recent reflux, took OTC tx.   Hair loss 12/16/2016   Headache(784.0)    uses RX- unsure of name   Hematuria 12/16/2016   History of migraine 12/16/2016   Hypertension    stress test- in past recent - [employer].       Hypertensive disorder 09/27/2019   Idiopathic peripheral neuropathy 01/10/2017   Knee pain, left anterior 12/16/2016   To F/U with Dr Ronnie Derby tomorrow morning-  [place] [facility]   Myalgia and myositis 12/16/2016   Obesity 12/16/2016   OSA (obstructive sleep apnea) 02/22/2019   Other constipation 12/16/2016   Pelvic pain in female 12/16/2016   Recurrent upper respiratory infection (URI)    recent cough, treated /w OTC   S/P lumbar laminectomy 11/12/2017   Screening for colon cancer 02/16/2019   Sinusitis 12/16/2016   Status post bilateral knee replacements 09/17/2016   Subdeltoid bursitis, left 12/16/2016   Trigger finger of both hands 12/16/2016   Trigger finger of right thumb 08/14/2017   Trigger middle finger of right hand 08/14/2017   Type 2 diabetes mellitus with hyperglycemia, without long-term current use of insulin ([place]) 12/16/2016   Formatting of this note might be different from the original. at goal   Vaginitis and vulvovaginitis 12/16/2016   Vertigo 12/16/2016   RH PT evaluate and treat   Vitamin D deficiency 12/16/2016   Wheezing 12/16/2016   Wheezy bronchitis 12/16/2016   Yeast infection of the skin 03/07/2017    Past Surgical History:  Procedure Laterality Date   CESAREAN SECTION     x2   JOINT REPLACEMENT     2005- L knee, arthroscopic procedure both knees    SUPERIOR OBLIQUE TUCK     tummy tuck - 1986   TOTAL KNEE ARTHROPLASTY  06/10/2011   Procedure: TOTAL KNEE ARTHROPLASTY;  Surgeon: Rudean Haskell, MD;  Location: [place];  Service: Orthopedics;  Laterality: Right;   TUBAL LIGATION      Current Medications: Current Meds  Medication Sig   albuterol (VENTOLIN HFA) 108 (90 Base) MCG/ACT inhaler Inhale 2 puffs into the lungs every 4 (four) hours as needed for wheezing or shortness of breath.   aspirin EC 81 MG tablet Take 81 mg by mouth daily.   atorvastatin (LIPITOR) 20 MG tablet Take 20 mg by mouth daily.   CALCIUM PO Take by mouth daily.    CINNAMON PO Take 1 tablet by mouth daily.     cloNIDine (CATAPRES) 0.3 MG tablet Take 0.3 mg by mouth 2 (two) times daily.   furosemide (LASIX) 20 MG tablet Take 1 tablet (20 mg total) by mouth 2 (two) times daily.   glyBURIDE (DIABETA) 5 MG tablet Take 5 mg by mouth 2 (two) times daily as needed.   hydrALAZINE (APRESOLINE) 50 MG tablet Take 50 mg by mouth 3 (three) times daily.   irbesartan-hydrochlorothiazide (AVALIDE) 300-12.5 MG tablet Take 1 tablet by mouth daily.   JANUVIA 100 MG tablet Take 100 mg by mouth daily.   predniSONE (DELTASONE) 20 MG tablet Take two tablets daily for three days, then one tablet daily for three days, then 0.5 tablets daily for 3 days.  Allergies:   Celebrex [celecoxib] and Morphine and related   Social History   Socioeconomic History   Marital status: Married    Spouse name: Not on file   Number of children: Not on file   Years of education: Not on file   Highest education level: Not on file  Occupational History   Not on file  Tobacco Use   Smoking status: Former Smoker    Quit date: 06/03/1979    Years since quitting: 40.3   Smokeless tobacco: Never Used  Substance and Sexual Activity   Alcohol use: Yes    Comment: wine occas   Drug use: No   Sexual activity: Yes    Birth control/protection: Surgical  Other Topics Concern   Not on file  Social History Narrative   Not on file   Social Determinants of Health   Financial Resource Strain:    Difficulty of Paying Living Expenses:   Food Insecurity:    Worried About Charity fundraiser in the Last Year:    Arboriculturist in the Last Year:   Transportation Needs:    Film/video editor (Medical):    Lack of Transportation (Non-Medical):   Physical Activity:    Days of Exercise per Week:    Minutes of Exercise per Session:   Stress:    Feeling of Stress :   Social Connections:    Frequency of Communication with Friends and Family:    Frequency of  Social Gatherings with Friends and Family:    Attends Religious Services:    Active Member of Clubs or Organizations:    Attends Music therapist:    Marital Status:      Family History: The patient's family history is negative for Anesthesia problems, Hypotension, Malignant hyperthermia, and Pseudochol deficiency. ROS:   Please see the history of present illness.    All other systems reviewed and are negative.  EKGs/Labs/Other Studies Reviewed:    The following studies were reviewed today:   Recent Labs: 09/28/2019: BUN 10; Creatinine, Ser 0.69; NT-Pro BNP 181; Potassium 4.0; Sodium 145  Recent Lipid Panel No results found for: CHOL, TRIG, HDL, CHOLHDL, VLDL, LDLCALC, LDLDIRECT  Physical Exam:    VS:  BP (!) 160/100    Pulse 60    Ht 5' 4.5" (1.638 m)    Wt 216 lb (98 kg)    LMP 03/11/2011    SpO2 99%    BMI 36.50 kg/m     Wt Readings from Last 3 Encounters:  10/06/19 216 lb (98 kg)  09/28/19 220 lb 6.4 oz (100 kg)  09/07/19 222 lb (100.7 kg)     GEN:  Well nourished, well developed in no acute distress HEENT: Normal NECK: No JVD; No carotid bruits LYMPHATICS: No lymphadenopathy CARDIAC: RRR, no murmurs, rubs, gallops RESPIRATORY:  Clear to auscultation without rales, wheezing or rhonchi  ABDOMEN: Soft, non-tender, non-distended MUSCULOSKELETAL:  No edema; No deformity  SKIN: Warm and dry NEUROLOGIC:  Alert and oriented x 3 PSYCHIATRIC:  Normal affect    Signed, Shirlee More, MD  10/06/2019 11:55 AM    [place]

## 2019-10-06 ENCOUNTER — Ambulatory Visit: Payer: Medicare Other | Admitting: Cardiology

## 2019-10-06 ENCOUNTER — Other Ambulatory Visit: Payer: Self-pay

## 2019-10-06 ENCOUNTER — Encounter: Payer: Self-pay | Admitting: Cardiology

## 2019-10-06 VITALS — BP 160/100 | HR 60 | Ht 64.5 in | Wt 216.0 lb

## 2019-10-06 DIAGNOSIS — R0602 Shortness of breath: Secondary | ICD-10-CM | POA: Diagnosis not present

## 2019-10-06 DIAGNOSIS — I119 Hypertensive heart disease without heart failure: Secondary | ICD-10-CM | POA: Diagnosis not present

## 2019-10-06 DIAGNOSIS — J4541 Moderate persistent asthma with (acute) exacerbation: Secondary | ICD-10-CM

## 2019-10-06 MED ORDER — SPIRONOLACTONE 25 MG PO TABS: 12.5000 mg | ORAL_TABLET | Freq: Every day | ORAL | 3 refills | Status: DC

## 2019-10-06 NOTE — Patient Instructions (Signed)
Medication Instructions:  Your physician has recommended you make the following change in your medication:  START: Spironolactone 12.5 mg take one tablet by mouth daily.  *If you need a refill on your cardiac medications before your next appointment, please call your pharmacy*   Lab Work: Your physician recommends that you return for lab work in: TODAY BMP, ProBNP If you have labs (blood work) drawn today and your tests are completely normal, you will receive your results only by: Marland Kitchen MyChart Message (if you have MyChart) OR . A paper copy in the mail If you have any lab test that is abnormal or we need to change your treatment, we will call you to review the results.   Testing/Procedures: None   Follow-Up: At [facility], you and your health needs are our priority.  As part of our continuing mission to provide you with exceptional heart care, we have created designated Provider Care Teams.  These Care Teams include your primary Cardiologist (physician) and Advanced Practice Providers (APPs -  Physician Assistants and Nurse Practitioners) who all work together to provide you with the care you need, when you need it.  We recommend signing up for the patient portal called "MyChart".  Sign up information is provided on this After Visit Summary.  MyChart is used to connect with patients for Virtual Visits (Telemedicine).  Patients are able to view lab/test results, encounter notes, upcoming appointments, etc.  Non-urgent messages can be sent to your provider as well.   To learn more about what you can do with MyChart, go to [email or web address].    Your next appointment:   6 week(s)  The format for your next appointment:   In Person  Provider:   Shirlee More, MD   Other Instructions

## 2019-10-07 ENCOUNTER — Telehealth: Payer: Self-pay

## 2019-10-07 DIAGNOSIS — I10 Essential (primary) hypertension: Secondary | ICD-10-CM

## 2019-10-07 LAB — PRO B NATRIURETIC PEPTIDE: NT-Pro BNP: 249 pg/mL (ref 0–287)

## 2019-10-07 LAB — BASIC METABOLIC PANEL
BUN/Creatinine Ratio: 19 (ref 12–28)
BUN: 17 mg/dL (ref 8–27)
CO2: 32 mmol/L — ABNORMAL HIGH (ref 20–29)
Calcium: 11.2 mg/dL — ABNORMAL HIGH (ref 8.7–10.3)
Chloride: 93 mmol/L — ABNORMAL LOW (ref 96–106)
Creatinine, Ser: 0.89 mg/dL (ref 0.57–1.00)
GFR calc Af Amer: 80 mL/min/{1.73_m2} (ref >59)
GFR calc non Af Amer: 69 mL/min/{1.73_m2} (ref >59)
Glucose: 222 mg/dL — ABNORMAL HIGH (ref 65–99)
Potassium: 3.4 mmol/L — ABNORMAL LOW (ref 3.5–5.2)
Sodium: 141 mmol/L (ref 134–144)

## 2019-10-07 NOTE — Telephone Encounter (Signed)
Patient returning call. She states she has an appointment today and to leave her a message if she does not answer.

## 2019-10-07 NOTE — Telephone Encounter (Signed)
Left message on patients voicemail to please return our call.   

## 2019-10-07 NOTE — Telephone Encounter (Signed)
-----   Message from [name], MD sent at 10/07/2019  7:52 AM EDT ----- Normal or stable result  I started her on spironolactone lets have her come back in 2 weeks check a BMP and we can do it in the [place] office

## 2019-10-07 NOTE — Telephone Encounter (Signed)
Spoke with patient regarding results and recommendation.  Patient verbalizes understanding and is agreeable to plan of care. Advised patient to call back with any issues or concerns.   Order for BMP in 2 weeks was placed

## 2019-10-14 ENCOUNTER — Telehealth: Payer: Self-pay

## 2019-10-14 LAB — BASIC METABOLIC PANEL
BUN/Creatinine Ratio: 14 (ref 12–28)
BUN: 13 mg/dL (ref 8–27)
CO2: 30 mmol/L — ABNORMAL HIGH (ref 20–29)
Calcium: 10.3 mg/dL (ref 8.7–10.3)
Chloride: 97 mmol/L (ref 96–106)
Creatinine, Ser: 0.96 mg/dL (ref 0.57–1.00)
GFR calc Af Amer: 73 mL/min/{1.73_m2} (ref >59)
GFR calc non Af Amer: 63 mL/min/{1.73_m2} (ref >59)
Glucose: 186 mg/dL — ABNORMAL HIGH (ref 65–99)
Potassium: 3.5 mmol/L (ref 3.5–5.2)
Sodium: 142 mmol/L (ref 134–144)

## 2019-10-14 NOTE — Telephone Encounter (Signed)
Spoke with patient regarding lab results and recommendation.  Patient verbalizes understanding and is agreeable to plan of care. Advised patient to call back with any issues or concerns.

## 2019-10-22 ENCOUNTER — Ambulatory Visit: Payer: Medicare Other | Admitting: Cardiology

## 2019-11-03 ENCOUNTER — Telehealth: Payer: Self-pay

## 2019-11-03 MED ORDER — FUROSEMIDE 20 MG PO TABS: 20.0000 mg | ORAL_TABLET | Freq: Two times a day (BID) | ORAL | 2 refills | Status: DC

## 2019-11-03 NOTE — Telephone Encounter (Signed)
Refill sent for Furosemide  

## 2019-11-19 ENCOUNTER — Ambulatory Visit: Payer: Medicare Other | Admitting: Cardiology

## 2019-11-19 ENCOUNTER — Other Ambulatory Visit: Payer: Self-pay

## 2019-11-19 ENCOUNTER — Encounter: Payer: Self-pay | Admitting: Cardiology

## 2019-11-19 VITALS — BP 160/80 | HR 58 | Ht 64.5 in | Wt 214.4 lb

## 2019-11-19 DIAGNOSIS — I119 Hypertensive heart disease without heart failure: Secondary | ICD-10-CM | POA: Diagnosis not present

## 2019-11-19 DIAGNOSIS — E785 Hyperlipidemia, unspecified: Secondary | ICD-10-CM

## 2019-11-19 DIAGNOSIS — E118 Type 2 diabetes mellitus with unspecified complications: Secondary | ICD-10-CM | POA: Diagnosis not present

## 2019-11-19 MED ORDER — SPIRONOLACTONE 25 MG PO TABS: 12.5000 mg | ORAL_TABLET | Freq: Every day | ORAL | 3 refills | Status: DC

## 2019-11-19 NOTE — Patient Instructions (Signed)
Medication Instructions:  Your physician recommends that you continue on your current medications as directed. Please refer to the Current Medication list given to you today.  *If you need a refill on your cardiac medications before your next appointment, please call your pharmacy*   Lab Work: Your physician recommends that you return for lab work in: TODAY BMP, ProBNP, Lipids If you have labs (blood work) drawn today and your tests are completely normal, you will receive your results only by: Marland Kitchen MyChart Message (if you have MyChart) OR . A paper copy in the mail If you have any lab test that is abnormal or we need to change your treatment, we will call you to review the results.   Testing/Procedures: None   Follow-Up: At [facility], you and your health needs are our priority.  As part of our continuing mission to provide you with exceptional heart care, we have created designated Provider Care Teams.  These Care Teams include your primary Cardiologist (physician) and Advanced Practice Providers (APPs -  Physician Assistants and Nurse Practitioners) who all work together to provide you with the care you need, when you need it.  We recommend signing up for the patient portal called "MyChart".  Sign up information is provided on this After Visit Summary.  MyChart is used to connect with patients for Virtual Visits (Telemedicine).  Patients are able to view lab/test results, encounter notes, upcoming appointments, etc.  Non-urgent messages can be sent to your provider as well.   To learn more about what you can do with MyChart, go to [email or web address].    Your next appointment:   6 month(s)  The format for your next appointment:   In Person  Provider:   Shirlee More, MD   Other Instructions

## 2019-11-19 NOTE — Progress Notes (Signed)
Cardiology Office Note:    Date:  11/19/2019   ID:  Catherine Maxwell, DOB October 15, 1955, MRN [id number]  PCP:  Myrlene Broker, MD  Cardiologist:  Shirlee More, MD    Referring MD: Myrlene Broker, MD    ASSESSMENT:    1. Hypertensive heart disease without heart failure   2. Type 2 diabetes mellitus with complication, without long-term current use of insulin ([place])   3. Hyperlipidemia, unspecified hyperlipidemia type    PLAN:    In order of problems listed above:  1. She is markedly improved we will continue her current low-dose loop diuretic MRA antihypertensives monitor blood pressure at home.  New York Heart Association class I 2. Stable she is on appropriate therapy managed by her PCP 3. Continue her statin will check a lipid profile   Next appointment: 6 months   Medication Adjustments/Labs and Tests Ordered: Current medicines are reviewed at length with the patient today.  Concerns regarding medicines are outlined above.  No orders of the defined types were placed in this encounter.  Meds ordered this encounter  Medications  . spironolactone (ALDACTONE) 25 MG tablet    Sig: Take 0.5 tablets (12.5 mg total) by mouth daily.    Dispense:  45 tablet    Refill:  3    Chief Complaint  Patient presents with  . Follow-up  . Congestive Heart Failure  . Hypertension    History of Present Illness:    Catherine Maxwell is a 64 y.o. female with a hx of  type 2 diabetes hypertension with heart failure hyperlipidemia and sinus bradycardia  last seen 10/06/2019 after an exacerbation of asthma.. Compliance with diet, lifestyle and medications: Yes  She is doing much better tracks her home blood pressures between 1 [id number] systolic over [id number].  No edema her weight is down no shortness of breath orthopnea chest pain palpitation or syncope and she has no asthma without a beta-blocker. Past Medical History:  Diagnosis Date  . Acute lymphadenitis 12/16/2016  . Arthritis    R  knee, back problem   . Atrophic vaginitis 09/27/2019  . Benign essential hypertension 12/16/2016   Formatting of this note might be different from the original. followed by cardiology,  stable on current meds,  continue DASH, ADA diet Formatting of this note might be different from the original. Overview:  stress test- in past recent - [employer]. [place]   //   renal artery Doppler May, 2015, normal renal arteries, no renal artery stenosis, aorta and iliacs normal.  Last Assessment & Pla  . Bradycardia, sinus 10/27/2013   Sinus bradycardia, EKG, Oct 27, 2013   . Cervical radiculopathy 05/16/2017  . Complication of anesthesia    panicky before anesthesia   . Congestion of upper airway 05/16/2017  . Diabetes ([place]) 10/26/2013   History of diabetes, type II, no mention of complication   . Diabetes mellitus    diag. x2 yrs. ago  . Ejection fraction   . Erysipelas 05/27/2019  . Essential hypertension    stress test- in past recent - [employer]. [place]   //   renal artery Doppler May, 2015, normal renal arteries, no renal artery stenosis, aorta and iliacs normal.   . GERD (gastroesophageal reflux disease)    recent reflux, took OTC tx.  . Hair loss 12/16/2016  . Headache(784.0)    uses RX- unsure of name  . Hematuria 12/16/2016  . History of migraine 12/16/2016  . Hypertension  stress test- in past recent - [employer]. [place]   . Hypertensive disorder 09/27/2019  . Idiopathic peripheral neuropathy 01/10/2017  . Knee pain, left anterior 12/16/2016   [facility]  . Myalgia and myositis 12/16/2016  . Obesity 12/16/2016  . OSA (obstructive sleep apnea) 02/22/2019  . Other constipation 12/16/2016  . Pelvic pain in female 12/16/2016  . Recurrent upper respiratory infection (URI)    recent cough, treated /w OTC  . S/P lumbar laminectomy 11/12/2017  . Screening for colon cancer 02/16/2019  . Sinusitis 12/16/2016  . Status post bilateral knee  replacements 09/17/2016  . Subdeltoid bursitis, left 12/16/2016  . Trigger finger of both hands 12/16/2016  . Trigger finger of right thumb 08/14/2017  . Trigger middle finger of right hand 08/14/2017  . Type 2 diabetes mellitus with hyperglycemia, without long-term current use of insulin ([place]) 12/16/2016   Formatting of this note might be different from the original. at goal  . Vaginitis and vulvovaginitis 12/16/2016  . Vertigo 12/16/2016   RH PT evaluate and treat  . Vitamin D deficiency 12/16/2016  . Wheezing 12/16/2016  . Wheezy bronchitis 12/16/2016  . Yeast infection of the skin 03/07/2017    Past Surgical History:  Procedure Laterality Date  . CESAREAN SECTION     x2  . JOINT REPLACEMENT     2005- L knee, arthroscopic procedure both knees   . SUPERIOR OBLIQUE TUCK     tummy tuck - 1986  . TOTAL KNEE ARTHROPLASTY  06/10/2011   Procedure: TOTAL KNEE ARTHROPLASTY;  Surgeon: Rudean Haskell, MD;  Location: [place];  Service: Orthopedics;  Laterality: Right;  . TUBAL LIGATION      Current Medications: Current Meds  Medication Sig  . albuterol (VENTOLIN HFA) 108 (90 Base) MCG/ACT inhaler Inhale 2 puffs into the lungs every 4 (four) hours as needed for wheezing or shortness of breath.  Marland Kitchen aspirin EC 81 MG tablet Take 81 mg by mouth daily.  Marland Kitchen atorvastatin (LIPITOR) 20 MG tablet Take 20 mg by mouth daily.  Marland Kitchen CALCIUM PO Take by mouth daily.  Marland Kitchen CINNAMON PO Take 1 tablet by mouth daily.    . cloNIDine (CATAPRES) 0.3 MG tablet Take 0.3 mg by mouth 2 (two) times daily.  . furosemide (LASIX) 20 MG tablet Take 1 tablet (20 mg total) by mouth 2 (two) times daily.  Marland Kitchen glyBURIDE (DIABETA) 5 MG tablet Take 5 mg by mouth 2 (two) times daily as needed.  . hydrALAZINE (APRESOLINE) 50 MG tablet Take 50 mg by mouth 3 (three) times daily.  . irbesartan-hydrochlorothiazide (AVALIDE) 300-12.5 MG tablet Take 1 tablet by mouth daily.  Marland Kitchen JANUVIA 100 MG tablet Take 100 mg by mouth daily.  Marland Kitchen spironolactone  (ALDACTONE) 25 MG tablet Take 0.5 tablets (12.5 mg total) by mouth daily.  . [DISCONTINUED] spironolactone (ALDACTONE) 25 MG tablet Take 0.5 tablets (12.5 mg total) by mouth daily.     Allergies:   Celebrex [celecoxib] and Morphine and related   Social History   Socioeconomic History  . Marital status: Married    Spouse name: Not on file  . Number of children: Not on file  . Years of education: Not on file  . Highest education level: Not on file  Occupational History  . Not on file  Tobacco Use  . Smoking status: Former Smoker    Quit date: 06/03/1979    Years since quitting: 40.4  . Smokeless tobacco: Never Used  Substance and Sexual Activity  . Alcohol use: Yes    Comment: wine occas  . Drug use: No  . Sexual activity: Yes    Birth control/protection: Surgical  Other Topics Concern  . Not on file  Social History Narrative  . Not on file   Social Determinants of Health   Financial Resource Strain:   . Difficulty of Paying Living Expenses:   Food Insecurity:   . Worried About Charity fundraiser in the Last Year:   . Arboriculturist in the Last Year:   Transportation Needs:   . Film/video editor (Medical):   Marland Kitchen Lack of Transportation (Non-Medical):   Physical Activity:   . Days of Exercise per Week:   . Minutes of Exercise per Session:   Stress:   . Feeling of Stress :   Social Connections:   . Frequency of Communication with Friends and Family:   . Frequency of Social Gatherings with Friends and Family:   . Attends Religious Services:   . Active Member of Clubs or Organizations:   . Attends Archivist Meetings:   Marland Kitchen Marital Status:      Family History: The patient's family history is negative for Anesthesia problems, Hypotension, Malignant hyperthermia, and Pseudochol deficiency. ROS:   Please see the history of present illness.    All other systems reviewed and are negative.  EKGs/Labs/Other Studies Reviewed:    The following studies were  reviewed today:    Recent Labs: 10/06/2019: NT-Pro BNP 249 10/13/2019: BUN 13; Creatinine, Ser 0.96; Potassium 3.5; Sodium 142  Recent Lipid Panel No results found for: CHOL, TRIG, HDL, CHOLHDL, VLDL, LDLCALC, LDLDIRECT  Physical Exam:    VS:  BP (!) 160/80   Pulse (!) 58   Ht 5' 4.5" (1.638 m)   Wt 214 lb 6.4 oz (97.3 kg)   LMP 03/11/2011   SpO2 96%   BMI 36.23 kg/m     Wt Readings from Last 3 Encounters:  11/19/19 214 lb 6.4 oz (97.3 kg)  10/06/19 216 lb (98 kg)  09/28/19 220 lb 6.4 oz (100 kg)    Repeat blood pressure by me 140/72 right arm large cuff GEN:  Well nourished, well developed in no acute distress HEENT: Normal NECK: No JVD; No carotid bruits LYMPHATICS: No lymphadenopathy CARDIAC: =RRR, no murmurs, rubs, gallops RESPIRATORY:  Clear to auscultation without rales, wheezing or rhonchi  ABDOMEN: Soft, non-tender, non-distended MUSCULOSKELETAL:  No edema; No deformity  SKIN: Warm and dry NEUROLOGIC:  Alert and oriented x 3 PSYCHIATRIC:  Normal affect    Signed, Shirlee More, MD  11/19/2019 11:04 AM    [place]

## 2019-11-20 LAB — BASIC METABOLIC PANEL
BUN/Creatinine Ratio: 21 (ref 12–28)
BUN: 19 mg/dL (ref 8–27)
CO2: 27 mmol/L (ref 20–29)
Calcium: 9.8 mg/dL (ref 8.7–10.3)
Chloride: 99 mmol/L (ref 96–106)
Creatinine, Ser: 0.89 mg/dL (ref 0.57–1.00)
GFR calc Af Amer: 80 mL/min/{1.73_m2} (ref >59)
GFR calc non Af Amer: 69 mL/min/{1.73_m2} (ref >59)
Glucose: 189 mg/dL — ABNORMAL HIGH (ref 65–99)
Potassium: 3.8 mmol/L (ref 3.5–5.2)
Sodium: 140 mmol/L (ref 134–144)

## 2019-11-20 LAB — LIPID PANEL
Chol/HDL Ratio: 3.5 ratio (ref 0.0–4.4)
Cholesterol, Total: 163 mg/dL (ref 100–199)
HDL: 46 mg/dL (ref >39)
LDL Chol Calc (NIH): 90 mg/dL (ref 0–99)
Triglycerides: 156 mg/dL — ABNORMAL HIGH (ref 0–149)
VLDL Cholesterol Cal: 27 mg/dL (ref 5–40)

## 2019-11-20 LAB — PRO B NATRIURETIC PEPTIDE: NT-Pro BNP: 66 pg/mL (ref 0–287)

## 2019-11-23 ENCOUNTER — Telehealth: Payer: Self-pay

## 2019-11-23 NOTE — Telephone Encounter (Signed)
Spoke with patient regarding lab results and recommendation.  Patient verbalizes understanding and is agreeable to plan of care. Advised patient to call back with any issues or concerns.

## 2020-02-20 DIAGNOSIS — D225 Melanocytic nevi of trunk: Secondary | ICD-10-CM

## 2020-02-20 HISTORY — DX: Melanocytic nevi of trunk: D22.5

## 2020-05-25 ENCOUNTER — Other Ambulatory Visit: Payer: Self-pay | Admitting: Cardiology

## 2020-05-25 NOTE — Telephone Encounter (Signed)
Per Dr. Bettina Gavia ok to fill. Rx refill sent to pharmacy.

## 2020-08-28 ENCOUNTER — Other Ambulatory Visit: Payer: Self-pay | Admitting: Cardiology

## 2020-10-23 DIAGNOSIS — I10 Essential (primary) hypertension: Secondary | ICD-10-CM | POA: Insufficient documentation

## 2020-11-01 ENCOUNTER — Ambulatory Visit: Payer: Medicare Other | Admitting: Cardiology

## 2020-11-01 NOTE — Progress Notes (Deleted)
Cardiology Office Note:    Date:  11/01/2020   ID:  Catherine Maxwell, DOB January 09, 1956, MRN [id number]  PCP:  Catherine Broker, MD  Cardiologist:  Catherine More, MD    Referring MD: Catherine Broker, MD    ASSESSMENT:    No diagnosis found. PLAN:    In order of problems listed above:  1. ***   Next appointment: ***   Medication Adjustments/Labs and Tests Ordered: Current medicines are reviewed at length with the patient today.  Concerns regarding medicines are outlined above.  No orders of the defined types were placed in this encounter.  No orders of the defined types were placed in this encounter.   No chief complaint on file.   History of Present Illness:    Catherine Maxwell is a 65 y.o. female with a hx of hypertensive heart disease without heart failure type 2 diabetes hyperlipidemia sinus bradycardia and asthma.  Last seen 11/19/2019. Compliance with diet, lifestyle and medications: *** Past Medical History:  Diagnosis Date  . Acute lymphadenitis 12/16/2016  . Arthritis    R knee, back problem   . Atrophic vaginitis 09/27/2019  . Benign essential hypertension 12/16/2016   Formatting of this note might be different from the original. followed by cardiology,  stable on current meds,  continue DASH, ADA diet Formatting of this note might be different from the original. Overview:  stress test- in past recent - [employer]. [place]   //   renal artery Doppler May, 2015, normal renal arteries, no renal artery stenosis, aorta and iliacs normal.  Last Assessment & Pla  . Bradycardia, sinus 10/27/2013   Sinus bradycardia, EKG, Oct 27, 2013   . Cervical radiculopathy 05/16/2017  . Complication of anesthesia    panicky before anesthesia   . Congestion of upper airway 05/16/2017  . Diabetes ([place]) 10/26/2013   History of diabetes, type II, no mention of complication   . Diabetes mellitus    diag. x2 yrs. ago  . Ejection fraction   . Erysipelas 05/27/2019  . Essential  hypertension    stress test- in past recent - [employer]. [place]   //   renal artery Doppler May, 2015, normal renal arteries, no renal artery stenosis, aorta and iliacs normal.   . GERD (gastroesophageal reflux disease)    recent reflux, took OTC tx.  . Hair loss 12/16/2016  . Headache(784.0)    uses RX- unsure of name  . Hematuria 12/16/2016  . History of migraine 12/16/2016  . Hypertension    stress test- in past recent - [employer]. [place]   . Hypertensive disorder 09/27/2019  . Idiopathic peripheral neuropathy 01/10/2017  . Knee pain, left anterior 12/16/2016   [facility]  . Myalgia and myositis 12/16/2016  . Obesity 12/16/2016  . OSA (obstructive sleep apnea) 02/22/2019  . Other constipation 12/16/2016  . Pelvic pain in female 12/16/2016  . Recurrent upper respiratory infection (URI)    recent cough, treated /w OTC  . S/P lumbar laminectomy 11/12/2017  . Screening for colon cancer 02/16/2019  . Sinusitis 12/16/2016  . Status post bilateral knee replacements 09/17/2016  . Subdeltoid bursitis, left 12/16/2016  . Trigger finger of both hands 12/16/2016  . Trigger finger of right thumb 08/14/2017  . Trigger middle finger of right hand 08/14/2017  . Type 2 diabetes mellitus with hyperglycemia, without long-term current use of insulin ([place]) 12/16/2016   Formatting of this note might be  different from the original. at goal  . Vaginitis and vulvovaginitis 12/16/2016  . Vertigo 12/16/2016   RH PT evaluate and treat  . Vitamin D deficiency 12/16/2016  . Wheezing 12/16/2016  . Wheezy bronchitis 12/16/2016  . Yeast infection of the skin 03/07/2017    Past Surgical History:  Procedure Laterality Date  . CESAREAN SECTION     x2  . JOINT REPLACEMENT     2005- L knee, arthroscopic procedure both knees   . SUPERIOR OBLIQUE TUCK     tummy tuck - 1986  . TOTAL KNEE ARTHROPLASTY  06/10/2011   Procedure: TOTAL KNEE ARTHROPLASTY;  Surgeon: Rudean Haskell, MD;  Location: [place];  Service: Orthopedics;  Laterality: Right;  . TUBAL LIGATION      Current Medications: No outpatient medications have been marked as taking for the 11/01/20 encounter (Appointment) with Catherine Priest, MD.     Allergies:   Celebrex [celecoxib] and Morphine and related   Social History   Socioeconomic History  . Marital status: Married    Spouse name: Not on file  . Number of children: Not on file  . Years of education: Not on file  . Highest education level: Not on file  Occupational History  . Not on file  Tobacco Use  . Smoking status: Former Smoker    Quit date: 06/03/1979    Years since quitting: 41.4  . Smokeless tobacco: Never Used  Vaping Use  . Vaping Use: Never used  Substance and Sexual Activity  . Alcohol use: Yes    Comment: wine occas  . Drug use: No  . Sexual activity: Yes    Birth control/protection: Surgical  Other Topics Concern  . Not on file  Social History Narrative  . Not on file   Social Determinants of Health   Financial Resource Strain: Not on file  Food Insecurity: Not on file  Transportation Needs: Not on file  Physical Activity: Not on file  Stress: Not on file  Social Connections: Not on file     Family History: The patient's ***family history is negative for Anesthesia problems, Hypotension, Malignant hyperthermia, and Pseudochol deficiency. ROS:   Please see the history of present illness.    All other systems reviewed and are negative.  EKGs/Labs/Other Studies Reviewed:    The following studies were reviewed today:  EKG:  EKG ordered today and personally reviewed.  The ekg ordered today demonstrates ***  Recent Labs: 11/19/2019: BUN 19; Creatinine, Ser 0.89; NT-Pro BNP 66; Potassium 3.8; Sodium 140  Recent Lipid Panel    Component Value Date/Time   CHOL 163 11/19/2019 1111   TRIG 156 (H) 11/19/2019 1111   HDL 46 11/19/2019 1111   CHOLHDL 3.5 11/19/2019 1111   LDLCALC 90 11/19/2019 1111     Physical Exam:    VS:  LMP 03/11/2011     Wt Readings from Last 3 Encounters:  11/19/19 214 lb 6.4 oz (97.3 kg)  10/06/19 216 lb (98 kg)  09/28/19 220 lb 6.4 oz (100 kg)     GEN: *** Well nourished, well developed in no acute distress HEENT: Normal NECK: No JVD; No carotid bruits LYMPHATICS: No lymphadenopathy CARDIAC: ***RRR, no murmurs, rubs, gallops RESPIRATORY:  Clear to auscultation without rales, wheezing or rhonchi  ABDOMEN: Soft, non-tender, non-distended MUSCULOSKELETAL:  No edema; No deformity  SKIN: Warm and dry NEUROLOGIC:  Alert and oriented x 3 PSYCHIATRIC:  Normal affect    Signed, Catherine More, MD  11/01/2020 7:43 AM  [facility]

## 2020-12-05 NOTE — Progress Notes (Signed)
Cardiology Office Note:    Date:  12/06/2020   ID:  Catherine Maxwell, DOB 1955-12-22, MRN [id number]  PCP:  Myrlene Broker, MD  Cardiologist:  Shirlee More, MD    Referring MD: Myrlene Broker, MD    ASSESSMENT:    1. Hypertensive heart disease without heart failure   2. Type 2 diabetes mellitus with complication, without long-term current use of insulin ([place])   3. Hyperlipidemia, unspecified hyperlipidemia type    PLAN:    In order of problems listed above:  She continues to do well asymptomatic from heart failure continue her current loop diuretic furosemide and multiple antihypertensives including clonidine hydralazine ARB thiazide diuretic and MRA.  I strongly encouraged her to continue her lifestyle changes sodium restriction exercise that is translated to a big improvement in the quality of her life.  Her EKG shows a stable pattern of LVH repolarization.  At this time I do not think she requires a repeat echocardiogram  stable on oral agents if she requires additional treatment SGLT2 inhibitor would be appropriate Continue her statin Check labs including renal function potassium liver function lipids and A1c at her request   Next appointment: 1 year   Medication Adjustments/Labs and Tests Ordered: Current medicines are reviewed at length with the patient today.  Concerns regarding medicines are outlined above.  No orders of the defined types were placed in this encounter.  No orders of the defined types were placed in this encounter.   Chief Complaint  Patient presents with   Follow-up   Hypertension   Congestive Heart Failure    History of Present Illness:    Catherine Maxwell is a 65 y.o. female with a hx of type 2 diabetes hypertensive heart disease with heart failure hyperlipidemia and sinus bradycardia last seen 11/19/2019.  Compliance with diet, lifestyle and medications: Yes  She is really embraced lifestyle changes weight loss exercise and has had no  recurrent bronchospasm. This is resulted a big improvement in the quality of her life. She has had no recurrent bronchospasm edema shortness of breath chest pain palpitation or syncope. She has mild stasis changes and she is bothered by the change in appearance of her skin about the ankle Her EKG in the office today shows a same prominent pattern of left ventricular hypertrophy and repolarization. Echocardiogram in 2015 showed no findings of hypertrophic cardiomyopathy She asked [place] to check blood pressure with a wrist device that is an accurate measuring 30 to 40 mm higher than the arm  Most recent labs [facility] 06/13/2020: Hemoglobin A1c 8.1 01/29/2020 creatinine 0.89 GFR 80 cc sodium 143 potassium 4.0 Cholesterol 181 LDL 102 triglycerides 140 HDL 57 Past Medical History:  Diagnosis Date   Acute lymphadenitis 12/16/2016   Arthritis    R knee, back problem    Atrophic vaginitis 09/27/2019   Benign essential hypertension 12/16/2016   Formatting of this note might be different from the original. followed by cardiology,  stable on current meds,  continue DASH, ADA diet Formatting of this note might be different from the original. Overview:  stress test- in past recent - [employer]. [place]   //   renal artery Doppler May, 2015, normal renal arteries, no renal artery stenosis, aorta and iliacs normal.  Last Assessment & Pla   Bradycardia, sinus 10/27/2013   Sinus bradycardia, EKG, Oct 27, 2013    Cervical radiculopathy 63/14/9702   Complication of anesthesia    panicky before anesthesia  Congestion of upper airway 05/16/2017   Diabetes ([place]) 10/26/2013   History of diabetes, type II, no mention of complication    Diabetes mellitus    diag. x2 yrs. ago   Ejection fraction    Erysipelas 05/27/2019   Essential hypertension    stress test- in past recent - [employer]. [place]   //   renal artery Doppler May, 2015, normal renal arteries, no renal artery stenosis, aorta and  iliacs normal.    GERD (gastroesophageal reflux disease)    recent reflux, took OTC tx.   Hair loss 12/16/2016   Headache(784.0)    uses RX- unsure of name   Hematuria 12/16/2016   History of migraine 12/16/2016   Hypertension    stress test- in past recent - [employer]. [place]    Hypertensive disorder 09/27/2019   Idiopathic peripheral neuropathy 01/10/2017   Knee pain, left anterior 12/16/2016   To F/U with Dr Ronnie Derby tomorrow morning-  [place] office   Myalgia and myositis 12/16/2016   Obesity 12/16/2016   OSA (obstructive sleep apnea) 02/22/2019   Other constipation 12/16/2016   Pelvic pain in female 12/16/2016   Recurrent upper respiratory infection (URI)    recent cough, treated /w OTC   S/P lumbar laminectomy 11/12/2017   Screening for colon cancer 02/16/2019   Sinusitis 12/16/2016   Status post bilateral knee replacements 09/17/2016   Subdeltoid bursitis, left 12/16/2016   Trigger finger of both hands 12/16/2016   Trigger finger of right thumb 08/14/2017   Trigger middle finger of right hand 08/14/2017   Type 2 diabetes mellitus with hyperglycemia, without long-term current use of insulin ([place]) 12/16/2016   Formatting of this note might be different from the original. at goal   Vaginitis and vulvovaginitis 12/16/2016   Vertigo 12/16/2016   RH PT evaluate and treat   Vitamin D deficiency 12/16/2016   Wheezing 12/16/2016   Wheezy bronchitis 12/16/2016   Yeast infection of the skin 03/07/2017    Past Surgical History:  Procedure Laterality Date   CESAREAN SECTION     x2   JOINT REPLACEMENT     2005- L knee, arthroscopic procedure both knees    SUPERIOR OBLIQUE TUCK     tummy tuck - 1986   TOTAL KNEE ARTHROPLASTY  06/10/2011   Procedure: TOTAL KNEE ARTHROPLASTY;  Surgeon: Rudean Haskell, MD;  Location: [place];  Service: Orthopedics;  Laterality: Right;   TUBAL LIGATION      Current Medications: Current Meds  Medication Sig   albuterol (VENTOLIN HFA) 108 (90 Base) MCG/ACT  inhaler Inhale 2 puffs into the lungs every 4 (four) hours as needed for wheezing or shortness of breath.   aspirin EC 81 MG tablet Take 81 mg by mouth daily.   atorvastatin (LIPITOR) 20 MG tablet Take 20 mg by mouth daily.   CALCIUM PO Take 1,200 mg by mouth daily.   CINNAMON PO Take 1 tablet by mouth daily.     cloNIDine (CATAPRES) 0.3 MG tablet Take 0.3 mg by mouth 2 (two) times daily.   furosemide (LASIX) 20 MG tablet TAKE 1 TABLET BY MOUTH  TWICE DAILY   glyBURIDE (DIABETA) 5 MG tablet Take 5 mg by mouth in the morning and at bedtime.   hydrALAZINE (APRESOLINE) 50 MG tablet Take 50 mg by mouth 3 (three) times daily.   irbesartan-hydrochlorothiazide (AVALIDE) 300-12.5 MG tablet Take 1 tablet by mouth daily.   JANUVIA 100 MG tablet Take 100 mg by mouth daily.   methocarbamol (  ROBAXIN) 750 MG tablet Take 750 mg by mouth 3 (three) times daily as needed for muscle spasms.   spironolactone (ALDACTONE) 25 MG tablet TAKE ONE-HALF TABLET BY  MOUTH DAILY     Allergies:   Celebrex [celecoxib] and Morphine and related   Social History   Socioeconomic History   Marital status: Married    Spouse name: Not on file   Number of children: Not on file   Years of education: Not on file   Highest education level: Not on file  Occupational History   Not on file  Tobacco Use   Smoking status: Former    Pack years: 0.00    Types: Cigarettes    Quit date: 06/03/1979    Years since quitting: 41.5   Smokeless tobacco: Never  Vaping Use   Vaping Use: Never used  Substance and Sexual Activity   Alcohol use: Yes    Comment: wine occas   Drug use: No   Sexual activity: Yes    Birth control/protection: Surgical  Other Topics Concern   Not on file  Social History Narrative   Not on file   Social Determinants of Health   Financial Resource Strain: Not on file  Food Insecurity: Not on file  Transportation Needs: Not on file  Physical Activity: Not on file  Stress: Not on file  Social  Connections: Not on file     Family History: The patient's family history is negative for Anesthesia problems, Hypotension, Malignant hyperthermia, and Pseudochol deficiency. ROS:   Please see the history of present illness.    All other systems reviewed and are negative.  EKGs/Labs/Other Studies Reviewed:    The following studies were reviewed today:  EKG:  EKG ordered today and personally reviewed.  The ekg ordered today demonstrates sinus rhythm left ventricular hypertrophy repolarization changes similar to previous EKG  Recent Labs: No results found for requested labs within last 8760 hours.  Recent Lipid Panel    Component Value Date/Time   CHOL 163 11/19/2019 1111   TRIG 156 (H) 11/19/2019 1111   HDL 46 11/19/2019 1111   CHOLHDL 3.5 11/19/2019 1111   LDLCALC 90 11/19/2019 1111    Physical Exam:    VS:  BP 134/74 (BP Location: Right Arm, Patient Position: Sitting, Cuff Size: Large)   Pulse 66   Ht 5' 4.5" (1.638 m)   Wt 212 lb 6.4 oz (96.3 kg)   LMP 03/11/2011   SpO2 99%   BMI 35.90 kg/m     Wt Readings from Last 3 Encounters:  12/06/20 212 lb 6.4 oz (96.3 kg)  11/19/19 214 lb 6.4 oz (97.3 kg)  10/06/19 216 lb (98 kg)     GEN:  Well nourished, well developed in no acute distress HEENT: Normal NECK: No JVD; No carotid bruits LYMPHATICS: No lymphadenopathy CARDIAC: RRR, no murmurs, rubs, gallops RESPIRATORY:  Clear to auscultation without rales, wheezing or rhonchi  ABDOMEN: Soft, non-tender, non-distended MUSCULOSKELETAL:  No edema; No deformity  SKIN: Warm and dry NEUROLOGIC:  Alert and oriented x 3 PSYCHIATRIC:  Normal affect    Signed, Shirlee More, MD  12/06/2020 3:09 PM    [facility]

## 2020-12-06 ENCOUNTER — Other Ambulatory Visit: Payer: Self-pay

## 2020-12-06 ENCOUNTER — Ambulatory Visit (INDEPENDENT_AMBULATORY_CARE_PROVIDER_SITE_OTHER): Payer: Medicare Other | Admitting: Cardiology

## 2020-12-06 ENCOUNTER — Encounter: Payer: Self-pay | Admitting: Cardiology

## 2020-12-06 VITALS — BP 134/74 | HR 66 | Ht 64.5 in | Wt 212.4 lb

## 2020-12-06 DIAGNOSIS — I119 Hypertensive heart disease without heart failure: Secondary | ICD-10-CM

## 2020-12-06 DIAGNOSIS — E785 Hyperlipidemia, unspecified: Secondary | ICD-10-CM | POA: Diagnosis not present

## 2020-12-06 DIAGNOSIS — E118 Type 2 diabetes mellitus with unspecified complications: Secondary | ICD-10-CM | POA: Diagnosis not present

## 2020-12-06 NOTE — Patient Instructions (Signed)
Medication Instructions:  Your physician recommends that you continue on your current medications as directed. Please refer to the Current Medication list given to you today.  *If you need a refill on your cardiac medications before your next appointment, please call your pharmacy*   Lab Work: Your physician recommends that you return for lab work in: TODAY CMP, Lipids, A1C If you have labs (blood work) drawn today and your tests are completely normal, you will receive your results only by: [place] (if you have MyChart) OR A paper copy in the mail If you have any lab test that is abnormal or we need to change your treatment, we will call you to review the results.   Testing/Procedures: None   Follow-Up: At [facility], you and your health needs are our priority.  As part of our continuing mission to provide you with exceptional heart care, we have created designated Provider Care Teams.  These Care Teams include your primary Cardiologist (physician) and Advanced Practice Providers (APPs -  Physician Assistants and Nurse Practitioners) who all work together to provide you with the care you need, when you need it.  We recommend signing up for the patient portal called "MyChart".  Sign up information is provided on this After Visit Summary.  MyChart is used to connect with patients for Virtual Visits (Telemedicine).  Patients are able to view lab/test results, encounter notes, upcoming appointments, etc.  Non-urgent messages can be sent to your provider as well.   To learn more about what you can do with MyChart, go to [email or web address].    Your next appointment:   1 year(s)  The format for your next appointment:   In Person  Provider:   Shirlee More, MD   Other Instructions

## 2020-12-07 ENCOUNTER — Telehealth: Payer: Self-pay | Admitting: Cardiology

## 2020-12-07 LAB — COMPREHENSIVE METABOLIC PANEL
ALT: 15 IU/L (ref 0–32)
AST: 16 IU/L (ref 0–40)
Albumin/Globulin Ratio: 1.8 (ref 1.2–2.2)
Albumin: 4.5 g/dL (ref 3.8–4.8)
Alkaline Phosphatase: 88 IU/L (ref 44–121)
BUN/Creatinine Ratio: 13 (ref 12–28)
BUN: 13 mg/dL (ref 8–27)
Bilirubin Total: 0.5 mg/dL (ref 0.0–1.2)
CO2: 25 mmol/L (ref 20–29)
Calcium: 10.6 mg/dL — ABNORMAL HIGH (ref 8.7–10.3)
Chloride: 96 mmol/L (ref 96–106)
Creatinine, Ser: 1 mg/dL (ref 0.57–1.00)
Globulin, Total: 2.5 g/dL (ref 1.5–4.5)
Glucose: 155 mg/dL — ABNORMAL HIGH (ref 65–99)
Potassium: 3.8 mmol/L (ref 3.5–5.2)
Sodium: 140 mmol/L (ref 134–144)
Total Protein: 7 g/dL (ref 6.0–8.5)
eGFR: 63 mL/min/{1.73_m2} (ref >59)

## 2020-12-07 LAB — LIPID PANEL
Chol/HDL Ratio: 3.6 ratio (ref 0.0–4.4)
Cholesterol, Total: 171 mg/dL (ref 100–199)
HDL: 48 mg/dL (ref >39)
LDL Chol Calc (NIH): 94 mg/dL (ref 0–99)
Triglycerides: 166 mg/dL — ABNORMAL HIGH (ref 0–149)
VLDL Cholesterol Cal: 29 mg/dL (ref 5–40)

## 2020-12-07 LAB — HEMOGLOBIN A1C
Est. average glucose Bld gHb Est-mCnc: 163 mg/dL
Hgb A1c MFr Bld: 7.3 % — ABNORMAL HIGH (ref 4.8–5.6)

## 2020-12-07 NOTE — Telephone Encounter (Signed)
Spoke with patient regarding results and recommendation.  Patient verbalizes understanding and is agreeable to plan of care. Advised patient to call back with any issues or concerns.  

## 2020-12-07 NOTE — Telephone Encounter (Signed)
Patient returning call for lab results. 

## 2020-12-28 ENCOUNTER — Ambulatory Visit: Payer: Medicare Other | Admitting: Cardiology

## 2021-02-20 DIAGNOSIS — S46811A Strain of other muscles, fascia and tendons at shoulder and upper arm level, right arm, initial encounter: Secondary | ICD-10-CM

## 2021-02-20 HISTORY — DX: Strain of other muscles, fascia and tendons at shoulder and upper arm level, right arm, initial encounter: S46.811A

## 2021-07-13 ENCOUNTER — Other Ambulatory Visit: Payer: Self-pay | Admitting: Cardiology

## 2021-09-04 DIAGNOSIS — M461 Sacroiliitis, not elsewhere classified: Secondary | ICD-10-CM | POA: Insufficient documentation

## 2021-09-04 HISTORY — DX: Sacroiliitis, not elsewhere classified: M46.1

## 2021-09-27 ENCOUNTER — Telehealth: Payer: Self-pay | Admitting: Cardiology

## 2021-09-27 NOTE — Telephone Encounter (Signed)
Pt has questions about "A change from one of my blood pressure medication." ? ?Please advise, ? ?Thank you! ?

## 2021-09-27 NOTE — Telephone Encounter (Signed)
Recommendations reviewed with pt as per Dr. Munley's note.  Pt verbalized understanding and had no additional questions.  

## 2021-09-27 NOTE — Telephone Encounter (Signed)
Pt states that she would like her clonidine changed as it is causing her hair to fallout. ?3/31 161/84 ?4/3 132/82 ?4/5 152/76 ? ?Please advise. ?

## 2021-12-12 ENCOUNTER — Ambulatory Visit: Payer: Medicare Other | Admitting: Cardiology

## 2021-12-12 ENCOUNTER — Encounter: Payer: Self-pay | Admitting: Cardiology

## 2021-12-12 VITALS — BP 146/84 | HR 66 | Ht 64.5 in | Wt 208.0 lb

## 2021-12-12 DIAGNOSIS — E118 Type 2 diabetes mellitus with unspecified complications: Secondary | ICD-10-CM

## 2021-12-12 DIAGNOSIS — E785 Hyperlipidemia, unspecified: Secondary | ICD-10-CM

## 2021-12-12 DIAGNOSIS — I119 Hypertensive heart disease without heart failure: Secondary | ICD-10-CM

## 2021-12-12 NOTE — Progress Notes (Signed)
Cardiology Office Note:    Date:  12/12/2021   ID:  Catherine Maxwell, DOB Jan 20, 1956, MRN [id number]  PCP:  Myrlene Broker, MD  Cardiologist:  Shirlee More, MD    Referring MD: Myrlene Broker, MD    ASSESSMENT:    1. Hypertensive heart disease without heart failure   2. Type 2 diabetes mellitus with complication, without long-term current use of insulin ([place])   3. Hyperlipidemia, unspecified hyperlipidemia type    PLAN:    In order of problems listed above:  Continues to do well heart failure is compensated no fluid overload continue her low-dose of diuretic along with multiple antihypertensives including Catapres hydralazine ARB thiazide diuretic and MRA.  I will recheck renal function potassium today Her EKG is quite abnormal may represent apical hypertrophic cardiomyopathy recheck echo Continue current treatment diabetes Januvia she has to do an A1c On statin check lipid profile   Next appointment: 6 months   Medication Adjustments/Labs and Tests Ordered: Current medicines are reviewed at length with the patient today.  Concerns regarding medicines are outlined above.  No orders of the defined types were placed in this encounter.  No orders of the defined types were placed in this encounter.   Chief Complaint  Patient presents with   Follow-up   Congestive Heart Failure    History of Present Illness:    Catherine Maxwell is a 66 y.o. female with a hx of  type 2 diabetes hypertensive heart disease with heart failure hyperlipidemia and sinus bradycardia  last seen 12/06/2020.  She has very good healthcare literacy takes good care of herself Weights are stable at home no edema orthopnea shortness of breath chest pain palpitation or syncope Blood pressures running typically less than 140s over 70s or less Exercises 3 days a week and a treadmill also does weights  Compliance with diet, lifestyle and medications: Yes  Recent labs 12/06/2020 cholesterol 171 LDL  94 A1c 7.3 hemoglobin 13.8 creatinine 1.0 potassium 3.8 Past Medical History:  Diagnosis Date   Acute lymphadenitis 12/16/2016   Arthritis    R knee, back problem    Atrophic vaginitis 09/27/2019   Benign essential hypertension 12/16/2016   Formatting of this note might be different from the original. followed by cardiology,  stable on current meds,  continue DASH, ADA diet Formatting of this note might be different from the original. Overview:  stress test- in past recent - [employer]. [place]   //   renal artery Doppler May, 2015, normal renal arteries, no renal artery stenosis, aorta and iliacs normal.  Last Assessment & Pla   Bradycardia, sinus 10/27/2013   Sinus bradycardia, EKG, Oct 27, 2013    Cervical radiculopathy 11/94/1740   Complication of anesthesia    panicky before anesthesia    Congestion of upper airway 05/16/2017   Diabetes ([place]) 10/26/2013   History of diabetes, type II, no mention of complication    Ejection fraction    Erysipelas 05/27/2019   Essential hypertension    stress test- in past recent - [employer]. [place]   //   renal artery Doppler May, 2015, normal renal arteries, no renal artery stenosis, aorta and iliacs normal.    GERD (gastroesophageal reflux disease)    recent reflux, took OTC tx.   Hair loss 12/16/2016   Headache(784.0)    uses RX- unsure of name   Hematuria 12/16/2016   History of migraine 12/16/2016   Hypertension    stress test- in past recent -  [employer]. [place]    Hypertensive disorder 09/27/2019   Idiopathic peripheral neuropathy 01/10/2017   Knee pain, left anterior 12/16/2016   To F/U with Dr Ronnie Derby tomorrow morning-  [place] office   Myalgia and myositis 12/16/2016   Obesity 12/16/2016   OSA (obstructive sleep apnea) 02/22/2019   Other constipation 12/16/2016   Pelvic pain in female 12/16/2016   Recurrent upper respiratory infection (URI)    recent cough, treated /w OTC   S/P lumbar laminectomy 11/12/2017    Screening for colon cancer 02/16/2019   Sinusitis 12/16/2016   Status post bilateral knee replacements 09/17/2016   Subdeltoid bursitis, left 12/16/2016   Trigger finger of both hands 12/16/2016   Trigger finger of right thumb 08/14/2017   Trigger middle finger of right hand 08/14/2017   Type 2 diabetes mellitus with hyperglycemia, without long-term current use of insulin ([place]) 12/16/2016   Formatting of this note might be different from the original. at goal   Vaginitis and vulvovaginitis 12/16/2016   Vertigo 12/16/2016   RH PT evaluate and treat   Vitamin D deficiency 12/16/2016   Wheezing 12/16/2016   Wheezy bronchitis 12/16/2016   Yeast infection of the skin 03/07/2017    Past Surgical History:  Procedure Laterality Date   CESAREAN SECTION     x2   JOINT REPLACEMENT     2005- L knee, arthroscopic procedure both knees    SUPERIOR OBLIQUE TUCK     tummy tuck - 1986   TOTAL KNEE ARTHROPLASTY  06/10/2011   Procedure: TOTAL KNEE ARTHROPLASTY;  Surgeon: Rudean Haskell, MD;  Location: [facility];  Service: Orthopedics;  Laterality: Right;   TUBAL LIGATION      Current Medications: Current Meds  Medication Sig   aspirin EC 81 MG tablet Take 81 mg by mouth daily.   atorvastatin (LIPITOR) 20 MG tablet Take 20 mg by mouth daily.   CINNAMON PO Take 1 tablet by mouth daily.     cloNIDine (CATAPRES) 0.3 MG tablet Take 0.3 mg by mouth 2 (two) times daily.   furosemide (LASIX) 20 MG tablet TAKE 1 TABLET BY MOUTH  TWICE DAILY (Patient taking differently: Take 20 mg by mouth 3 (three) times a week. Monday - Wednesday - Friday)   glyBURIDE (DIABETA) 5 MG tablet Take 5 mg by mouth in the morning and at bedtime.   hydrALAZINE (APRESOLINE) 50 MG tablet Take 50 mg by mouth 3 (three) times daily.   irbesartan-hydrochlorothiazide (AVALIDE) 300-12.5 MG tablet Take 1 tablet by mouth daily.   JANUVIA 100 MG tablet Take 100 mg by mouth daily.   methocarbamol (ROBAXIN) 750 MG tablet Take 750 mg by  mouth 3 (three) times daily as needed for muscle spasms.   spironolactone (ALDACTONE) 25 MG tablet TAKE ONE-HALF TABLET BY  MOUTH DAILY     Allergies:   Celebrex [celecoxib] and Morphine and related   Social History   Socioeconomic History   Marital status: Married    Spouse name: Not on file   Number of children: Not on file   Years of education: Not on file   Highest education level: Not on file  Occupational History   Not on file  Tobacco Use   Smoking status: Former    Types: Cigarettes    Quit date: 06/03/1979    Years since quitting: 42.5    Passive exposure: Past   Smokeless tobacco: Never  Vaping Use   Vaping Use: Never used  Substance and Sexual Activity   Alcohol  use: Yes    Comment: wine occas   Drug use: No   Sexual activity: Yes    Birth control/protection: Surgical  Other Topics Concern   Not on file  Social History Narrative   Not on file   Social Determinants of Health   Financial Resource Strain: Not on file  Food Insecurity: Not on file  Transportation Needs: Not on file  Physical Activity: Not on file  Stress: Not on file  Social Connections: Not on file     Family History: The patient's family history is negative for Anesthesia problems, Hypotension, Malignant hyperthermia, and Pseudochol deficiency. ROS:   Please see the history of present illness.    All other systems reviewed and are negative.  EKGs/Labs/Other Studies Reviewed:    The following studies were reviewed today: MPI 2018-11-05: Study Highlights The left ventricular ejection fraction is hyperdynamic (>65%). Nuclear stress EF: 68%. There was no ST segment deviation noted during stress. The study is normal. This is a low risk study. Apical thinning EKG:  EKG ordered today and personally reviewed.  The ekg ordered today demonstrates sinus rhythm marked pattern of LVH repolarization not significantly different than baseline  Recent Labs: No results found for requested labs  within last 365 days.  Recent Lipid Panel    Component Value Date/Time   CHOL 171 12/06/2020 1513   TRIG 166 (H) 12/06/2020 1513   HDL 48 12/06/2020 1513   CHOLHDL 3.6 12/06/2020 1513   LDLCALC 94 12/06/2020 1513    Physical Exam:    VS:  BP (!) 146/84 (BP Location: Right Arm, Patient Position: Sitting, Cuff Size: Large)   Pulse 66   Ht 5' 4.5" (1.638 m)   Wt 208 lb (94.3 kg)   LMP 03/11/2011   SpO2 96%   BMI 35.15 kg/m     Wt Readings from Last 3 Encounters:  12/12/21 208 lb (94.3 kg)  12/06/20 212 lb 6.4 oz (96.3 kg)  11/19/19 214 lb 6.4 oz (97.3 kg)     GEN:  Well nourished, well developed in no acute distress HEENT: Normal NECK: No JVD; No carotid bruits LYMPHATICS: No lymphadenopathy CARDIAC: RRR, no murmurs, rubs, gallops RESPIRATORY:  Clear to auscultation without rales, wheezing or rhonchi  ABDOMEN: Soft, non-tender, non-distended MUSCULOSKELETAL:  No edema; No deformity  SKIN: Warm and dry NEUROLOGIC:  Alert and oriented x 3 PSYCHIATRIC:  Normal affect    Signed, Shirlee More, MD  12/12/2021 2:35 PM    [facility]

## 2021-12-12 NOTE — Patient Instructions (Signed)
Medication Instructions:  Your physician recommends that you continue on your current medications as directed. Please refer to the Current Medication list given to you today.  *If you need a refill on your cardiac medications before your next appointment, please call your pharmacy*   Lab Work: Your physician recommends that you return for lab work in:   Labs today: CMP, Lipids, Hgb A1c  If you have labs (blood work) drawn today and your tests are completely normal, you will receive your results only by: [place] (if you have MyChart) OR A paper copy in the mail If you have any lab test that is abnormal or we need to change your treatment, we will call you to review the results.   Testing/Procedures: Your physician has requested that you have an echocardiogram. Echocardiography is a painless test that uses sound waves to create images of your heart. It provides your doctor with information about the size and shape of your heart and how well your heart's chambers and valves are working. This procedure takes approximately one hour. There are no restrictions for this procedure.    Follow-Up: At [facility], you and your health needs are our priority.  As part of our continuing mission to provide you with exceptional heart care, we have created designated Provider Care Teams.  These Care Teams include your primary Cardiologist (physician) and Advanced Practice Providers (APPs -  Physician Assistants and Nurse Practitioners) who all work together to provide you with the care you need, when you need it.  We recommend signing up for the patient portal called "MyChart".  Sign up information is provided on this After Visit Summary.  MyChart is used to connect with patients for Virtual Visits (Telemedicine).  Patients are able to view lab/test results, encounter notes, upcoming appointments, etc.  Non-urgent messages can be sent to your provider as well.   To learn more about what you can do  with MyChart, go to [email or web address].    Your next appointment:   6 month(s)  The format for your next appointment:   In Person  Provider:   Shirlee More, MD    Other Instructions None  Important Information About Sugar          [employer]  Tips to measure your blood pressure correctly  To determine whether you have hypertension, a medical professional will take a blood pressure reading. How you prepare for the test, the position of your arm, and other factors can change a blood pressure reading by 10% or more. That could be enough to hide high blood pressure, start you on a drug you don't really need, or lead your doctor to incorrectly adjust your medications. National and international guidelines offer specific instructions for measuring blood pressure. If a doctor, nurse, or medical assistant isn't doing it right, don't hesitate to ask him or her to get with the guidelines. Here's what you can do to ensure a correct reading:  Don't drink a caffeinated beverage or smoke during the 30 minutes before the test.  Sit quietly for five minutes before the test begins.  During the measurement, sit in a chair with your feet on the floor and your arm supported so your elbow is at about heart level.  The inflatable part of the cuff should completely cover at least 80% of your upper arm, and the cuff should be placed on bare skin, not over a shirt.  Don't talk during the measurement.  Have your blood pressure measured twice, with a  brief break in between. If the readings are different by 5 points or more, have it done a third time. There are times to break these rules. If you sometimes feel lightheaded when getting out of bed in the morning or when you stand after sitting, you should have your blood pressure checked while seated and then while standing to see if it falls from one position to the next. Because blood pressure varies throughout the day, your doctor will rarely  diagnose hypertension on the basis of a single reading. Instead, he or she will want to confirm the measurements on at least two occasions, usually within a few weeks of one another. The exception to this rule is if you have a blood pressure reading of 180/110 mm Hg or higher. A result this high usually calls for prompt treatment. It's also a good idea to have your blood pressure measured in both arms at least once, since the reading in one arm (usually the right) may be higher than that in the left. A 2014 study in The American Journal of Medicine of nearly 3,400 people found average arm- to-arm differences in systolic blood pressure of about 5 points. The higher number should be used to make treatment decisions. In 2017, new guidelines from the [place], the [employer] of Cardiology, and nine other health organizations lowered the diagnosis of high blood pressure to 130/80 mm Hg or higher for all adults. The guidelines also redefined the various blood pressure categories to now include normal, elevated, Stage 1 hypertension, Stage 2 hypertension, and hypertensive crisis (see "Blood pressure categories"). Blood pressure categories  Blood pressure category SYSTOLIC (upper number)  DIASTOLIC (lower number)  Normal Less than 120 mm Hg and Less than 80 mm Hg  Elevated 120-129 mm Hg and Less than 80 mm Hg  High blood pressure: Stage 1 hypertension 130-139 mm Hg or 80-89 mm Hg  High blood pressure: Stage 2 hypertension 140 mm Hg or higher or 90 mm Hg or higher  Hypertensive crisis (consult your doctor immediately) Higher than 180 mm Hg and/or Higher than 120 mm Hg  Source: American Heart Association and American Stroke Association. For more on getting your blood pressure under control, buy Controlling Your Blood Pressure, a Special Health Report from [facility].

## 2021-12-13 ENCOUNTER — Other Ambulatory Visit: Payer: Self-pay

## 2021-12-13 DIAGNOSIS — I119 Hypertensive heart disease without heart failure: Secondary | ICD-10-CM

## 2021-12-13 LAB — LIPID PANEL
Chol/HDL Ratio: 3.2 ratio (ref 0.0–4.4)
Cholesterol, Total: 171 mg/dL (ref 100–199)
HDL: 53 mg/dL
LDL Chol Calc (NIH): 98 mg/dL (ref 0–99)
Triglycerides: 112 mg/dL (ref 0–149)
VLDL Cholesterol Cal: 20 mg/dL (ref 5–40)

## 2021-12-13 LAB — COMPREHENSIVE METABOLIC PANEL
ALT: 15 IU/L (ref 0–32)
AST: 16 IU/L (ref 0–40)
Albumin/Globulin Ratio: 2.4 — ABNORMAL HIGH (ref 1.2–2.2)
Albumin: 4.7 g/dL (ref 3.8–4.8)
Alkaline Phosphatase: 85 IU/L (ref 44–121)
BUN/Creatinine Ratio: 22 (ref 12–28)
BUN: 18 mg/dL (ref 8–27)
Bilirubin Total: 0.3 mg/dL (ref 0.0–1.2)
CO2: 26 mmol/L (ref 20–29)
Calcium: 10.9 mg/dL — ABNORMAL HIGH (ref 8.7–10.3)
Chloride: 100 mmol/L (ref 96–106)
Creatinine, Ser: 0.83 mg/dL (ref 0.57–1.00)
Globulin, Total: 2 g/dL (ref 1.5–4.5)
Glucose: 143 mg/dL — ABNORMAL HIGH (ref 70–99)
Potassium: 4 mmol/L (ref 3.5–5.2)
Sodium: 143 mmol/L (ref 134–144)
Total Protein: 6.7 g/dL (ref 6.0–8.5)
eGFR: 78 mL/min/{1.73_m2} (ref >59)

## 2021-12-13 LAB — HEMOGLOBIN A1C
Est. average glucose Bld gHb Est-mCnc: 154 mg/dL
Hgb A1c MFr Bld: 7 % — ABNORMAL HIGH (ref 4.8–5.6)

## 2021-12-19 LAB — PTH, INTACT AND CALCIUM
Calcium: 10.6 mg/dL — ABNORMAL HIGH (ref 8.7–10.3)
PTH: 37 pg/mL (ref 15–65)

## 2021-12-19 LAB — CALCIUM, IONIZED: Calcium, Ion: 5.2 mg/dL (ref 4.5–5.6)

## 2021-12-26 ENCOUNTER — Other Ambulatory Visit: Payer: Medicare Other

## 2021-12-26 ENCOUNTER — Ambulatory Visit (INDEPENDENT_AMBULATORY_CARE_PROVIDER_SITE_OTHER): Payer: Medicare Other

## 2021-12-26 DIAGNOSIS — E785 Hyperlipidemia, unspecified: Secondary | ICD-10-CM

## 2021-12-26 DIAGNOSIS — I119 Hypertensive heart disease without heart failure: Secondary | ICD-10-CM | POA: Diagnosis not present

## 2021-12-26 DIAGNOSIS — E118 Type 2 diabetes mellitus with unspecified complications: Secondary | ICD-10-CM | POA: Diagnosis not present

## 2021-12-26 LAB — ECHOCARDIOGRAM COMPLETE
Area-P 1/2: 4.68 cm2
S' Lateral: 2.5 cm

## 2022-01-01 ENCOUNTER — Telehealth: Payer: Self-pay | Admitting: Cardiology

## 2022-01-01 NOTE — Telephone Encounter (Signed)
Error

## 2022-01-01 NOTE — Telephone Encounter (Signed)
Patient is returning call to discuss lab results. 

## 2022-01-02 ENCOUNTER — Telehealth: Payer: Self-pay

## 2022-01-02 NOTE — Telephone Encounter (Signed)
Results reviewed with pt as per Dr. Krasowski's note.  Pt verbalized understanding and had no additional questions. Routed to PCP  

## 2022-01-02 NOTE — Telephone Encounter (Signed)
Patient informed of results.  

## 2022-04-07 ENCOUNTER — Other Ambulatory Visit: Payer: Self-pay | Admitting: Cardiology

## 2022-04-08 NOTE — Telephone Encounter (Signed)
Spironolactone refill sent to pharmacy

## 2022-06-13 ENCOUNTER — Encounter: Payer: Self-pay | Admitting: Cardiology

## 2022-06-13 ENCOUNTER — Ambulatory Visit: Payer: Medicare Other | Attending: Cardiology | Admitting: Cardiology

## 2022-06-13 VITALS — BP 116/68 | HR 80 | Ht 64.6 in | Wt 212.0 lb

## 2022-06-13 DIAGNOSIS — L659 Nonscarring hair loss, unspecified: Secondary | ICD-10-CM

## 2022-06-13 DIAGNOSIS — E118 Type 2 diabetes mellitus with unspecified complications: Secondary | ICD-10-CM | POA: Diagnosis not present

## 2022-06-13 DIAGNOSIS — R001 Bradycardia, unspecified: Secondary | ICD-10-CM

## 2022-06-13 DIAGNOSIS — I119 Hypertensive heart disease without heart failure: Secondary | ICD-10-CM

## 2022-06-13 DIAGNOSIS — E785 Hyperlipidemia, unspecified: Secondary | ICD-10-CM | POA: Diagnosis not present

## 2022-06-13 NOTE — Patient Instructions (Addendum)
[employer]beat  Tips to measure your blood pressure correctly  To determine whether you have hypertension, a medical professional will take a blood pressure reading. How you prepare for the test, the position of your arm, and other factors can change a blood pressure reading by 10% or more. That could be enough to hide high blood pressure, start you on a drug you don't really need, or lead your doctor to incorrectly adjust your medications. National and international guidelines offer specific instructions for measuring blood pressure. If a doctor, nurse, or medical assistant isn't doing it right, don't hesitate to ask him or her to get with the guidelines. Here's what you can do to ensure a correct reading:  Don't drink a caffeinated beverage or smoke during the 30 minutes before the test.  Sit quietly for five minutes before the test begins.  During the measurement, sit in a chair with your feet on the floor and your arm supported so your elbow is at about heart level.  The inflatable part of the cuff should completely cover at least 80% of your upper arm, and the cuff should be placed on bare skin, not over a shirt.  Don't talk during the measurement.  Have your blood pressure measured twice, with a brief break in between. If the readings are different by 5 points or more, have it done a third time. There are times to break these rules. If you sometimes feel lightheaded when getting out of bed in the morning or when you stand after sitting, you should have your blood pressure checked while seated and then while standing to see if it falls from one position to the next. Because blood pressure varies throughout the day, your doctor will rarely diagnose hypertension on the basis of a single reading. Instead, he or she will want to confirm the measurements on at least two occasions, usually within a few weeks of one another. The exception to this rule is if you have a blood pressure reading of 180/110 mm Hg  or higher. A result this high usually calls for prompt treatment. It's also a good idea to have your blood pressure measured in both arms at least once, since the reading in one arm (usually the right) may be higher than that in the left. A 2014 study in The American Journal of Medicine of nearly 3,400 people found average arm- to-arm differences in systolic blood pressure of about 5 points. The higher number should be used to make treatment decisions. In 2017, new guidelines from the [place], the [employer] of Cardiology, and nine other health organizations lowered the diagnosis of high blood pressure to 130/80 mm Hg or higher for all adults. The guidelines also redefined the various blood pressure categories to now include normal, elevated, Stage 1 hypertension, Stage 2 hypertension, and hypertensive crisis (see "Blood pressure categories"). Blood pressure categories  Blood pressure category SYSTOLIC (upper number)  DIASTOLIC (lower number)  Normal Less than 120 mm Hg and Less than 80 mm Hg  Elevated 120-129 mm Hg and Less than 80 mm Hg  High blood pressure: Stage 1 hypertension 130-139 mm Hg or 80-89 mm Hg  High blood pressure: Stage 2 hypertension 140 mm Hg or higher or 90 mm Hg or higher  Hypertensive crisis (consult your doctor immediately) Higher than 180 mm Hg and/or Higher than 120 mm Hg  Source: American Heart Association and American Stroke Association. For more on getting your blood pressure under control, buy Controlling Your Blood  Pressure, a Special Health Report from [facility]. Medication Instructions:  Your physician has recommended you make the following change in your medication:  If in 2 weeks BP remains less than 140 decrease Clonidine to 1/2 tablet (0.15) two times daily Increase Spironolactone to 25 mg once daily  *If you need a refill on your cardiac medications before your next appointment, please call your pharmacy*   Lab  Work: NONE If you have labs (blood work) drawn today and your tests are completely normal, you will receive your results only by: [place] (if you have MyChart) OR A paper copy in the mail If you have any lab test that is abnormal or we need to change your treatment, we will call you to review the results.   Testing/Procedures: NONE   Follow-Up: At [facility], you and your health needs are our priority.  As part of our continuing mission to provide you with exceptional heart care, we have created designated Provider Care Teams.  These Care Teams include your primary Cardiologist (physician) and Advanced Practice Providers (APPs -  Physician Assistants and Nurse Practitioners) who all work together to provide you with the care you need, when you need it.  We recommend signing up for the patient portal called "MyChart".  Sign up information is provided on this After Visit Summary.  MyChart is used to connect with patients for Virtual Visits (Telemedicine).  Patients are able to view lab/test results, encounter notes, upcoming appointments, etc.  Non-urgent messages can be sent to your provider as well.   To learn more about what you can do with MyChart, go to [email or web address].    Your next appointment:   6 month(s)  The format for your next appointment:   In Person  Provider:   [name] MD Other Instructions   Important Information About Sugar

## 2022-06-13 NOTE — Progress Notes (Signed)
Cardiology Office Note:    Date:  06/13/2022   ID:  Catherine Maxwell, DOB 28-Jul-1955, MRN [id number]  PCP:  Myrlene Broker, MD  Cardiologist:  Shirlee More, MD    Referring MD: Myrlene Broker, MD    ASSESSMENT:    1. Hypertensive heart disease without heart failure   2. Type 2 diabetes mellitus with complication, without long-term current use of insulin (Catherine Maxwell)   3. Hyperlipidemia, unspecified hyperlipidemia type   4. Bradycardia, sinus   5. Hair loss    PLAN:    In order of problems listed above:  Her previous resistant hypertension is markedly improved I think there is an opportunity to reduce or even discontinue clonidine which is causing her concern of hair loss will increase her spironolactone to 25 mg daily continue ARB thiazide diuretic hydralazine furosemide.  She will trend record and drop off a list of blood pressure a few weeks Markedly improved she has been very careful about diet is now taking semaglutide involved in a good activity program will continue her home medications directed by her PCP Lipids are at target she will continue high intensity statin with diabetes Stable bradycardia Start to decrease and hopefully withdraw clonidine a slow gradual process to avoid rebound   Next appointment: 6 months   Medication Adjustments/Labs and Tests Ordered: Current medicines are reviewed at length with the patient today.  Concerns regarding medicines are outlined above.  No orders of the defined types were placed in this encounter.  No orders of the defined types were placed in this encounter.   No chief complaint on file.   History of Present Illness:    Catherine Maxwell is a 66 y.o. female with a hx of type 2 diabetes hypertensive heart disease with heart failure hyperlipidemia and sinus bradycardia last seen 12/12/2021.  She had an echocardiogram performed 12/26/2021 showing mild LVH normal systolic diastolic function EF 60 to 65%.  Right ventricle  normal size and function left atrium is mildly enlarged and no valvular abnormality was seen. 1. Left ventricular ejection fraction, by estimation, is 60 to 65%. The  left ventricle has normal function. The left ventricle has no regional  wall motion abnormalities. There is mild left ventricular hypertrophy.  Left ventricular diastolic parameters  were normal.   2. Right ventricular systolic function is normal. The right ventricular  size is normal. There is mildly elevated pulmonary artery systolic  pressure.   3. Left atrial size was mildly dilated.   4. The mitral valve is normal in structure. No evidence of mitral valve  regurgitation. No evidence of mitral stenosis.   5. The aortic valve is normal in structure. Aortic valve regurgitation is  not visualized. No aortic stenosis is present.   6. The inferior vena cava is normal in size with greater than 50%  respiratory variability, suggesting right atrial pressure of 3 mmHg.  Most recent labs [facility] PCP 03/28/2022: CMP is normal potassium 4.4 creatinine 0.96 normal liver function test Cholesterol 167 LDL 94 triglycerides 143 HDL 47  Compliance with diet, lifestyle and medications: Yes  Overall she has done well [facility] and is not having edema shortness of breath chest pain palpitation or syncope She is distressed about hair loss spoke to the pharmacist they told him the clonidine is the problem and she requests that we decrease the dose I told her she will need to check her blood pressure with a validated  device good technique regularly and will decrease the dose of her clonidine we cannot stop it because of the concern of clonidine withdrawal  Past Medical History:  Diagnosis Date   Acute lymphadenitis 12/16/2016   Arthritis    R knee, back problem    Atrophic vaginitis 09/27/2019   Benign essential hypertension 12/16/2016   Formatting of this note might be different from the  original. followed by cardiology,  stable on current meds,  continue DASH, ADA diet Formatting of this note might be different from the original. Overview:  stress test- in past recent - [employer]. [place]   //   renal artery Doppler May, 2015, normal renal arteries, no renal artery stenosis, aorta and iliacs normal.  Last Assessment & Pla   Bradycardia, sinus 10/27/2013   Sinus bradycardia, EKG, Oct 27, 2013    Cervical radiculopathy 03/30/1218   Complication of anesthesia    panicky before anesthesia    Congestion of upper airway 05/16/2017   Diabetes (Catherine Maxwell) 10/26/2013   History of diabetes, type II, no mention of complication    Ejection fraction    Erysipelas 05/27/2019   Essential hypertension    stress test- in past recent - [employer]. [place]   //   renal artery Doppler May, 2015, normal renal arteries, no renal artery stenosis, aorta and iliacs normal.    GERD (gastroesophageal reflux disease)    recent reflux, took OTC tx.   Hair loss 12/16/2016   Headache(784.0)    uses RX- unsure of name   Hematuria 12/16/2016   History of migraine 12/16/2016   Hypertension    stress test- in past recent - [employer]. [place]    Hypertensive disorder 09/27/2019   Idiopathic peripheral neuropathy 01/10/2017   Knee pain, left anterior 12/16/2016   To F/U with Dr Ronnie Derby tomorrow morning-  [place] office   Myalgia and myositis 12/16/2016   Obesity 12/16/2016   OSA (obstructive sleep apnea) 02/22/2019   Other constipation 12/16/2016   Pelvic pain in female 12/16/2016   Recurrent upper respiratory infection (URI)    recent cough, treated /w OTC   S/P lumbar laminectomy 11/12/2017   Screening for colon cancer 02/16/2019   Sinusitis 12/16/2016   Status post bilateral knee replacements 09/17/2016   Subdeltoid bursitis, left 12/16/2016   Trigger finger of both hands 12/16/2016   Trigger finger of right thumb 08/14/2017   Trigger middle finger of right hand 08/14/2017    Type 2 diabetes mellitus with hyperglycemia, without long-term current use of insulin ([place]) 12/16/2016   Formatting of this note might be different from the original. at goal   Vaginitis and vulvovaginitis 12/16/2016   Vertigo 12/16/2016   RH PT evaluate and treat   Vitamin D deficiency 12/16/2016   Wheezing 12/16/2016   Wheezy bronchitis 12/16/2016   Yeast infection of the skin 03/07/2017    Past Surgical History:  Procedure Laterality Date   CESAREAN SECTION     x2   JOINT REPLACEMENT     2005- L knee, arthroscopic procedure both knees    SUPERIOR OBLIQUE TUCK     tummy tuck - 1986   TOTAL KNEE ARTHROPLASTY  06/10/2011   Procedure: TOTAL KNEE ARTHROPLASTY;  Surgeon: Rudean Haskell, MD;  Location: [facility];  Service: Orthopedics;  Laterality: Right;   TUBAL LIGATION      Current Medications: Current Meds  Medication Sig   aspirin EC 81 MG tablet Take 81 mg by mouth daily.   atorvastatin (LIPITOR) 20  MG tablet Take 20 mg by mouth daily.   CINNAMON PO Take 1 tablet by mouth daily.     cloNIDine (CATAPRES) 0.3 MG tablet Take 0.3 mg by mouth 2 (two) times daily.   furosemide (LASIX) 20 MG tablet Take 20 mg by mouth every Monday, Wednesday, and Friday.   glyBURIDE (DIABETA) 5 MG tablet Take 5 mg by mouth in the morning and at bedtime.   hydrALAZINE (APRESOLINE) 50 MG tablet Take 50 mg by mouth 3 (three) times daily.   irbesartan-hydrochlorothiazide (AVALIDE) 300-12.5 MG tablet Take 1 tablet by mouth daily.   methocarbamol (ROBAXIN) 750 MG tablet Take 750 mg by mouth 3 (three) times daily as needed for muscle spasms.   OZEMPIC, 0.25 OR 0.5 MG/DOSE, 2 MG/3ML SOPN Inject 0.5 mg into the skin once a week.     Allergies:   Celebrex [celecoxib] and Morphine and related   Social History   Socioeconomic History   Marital status: Married    Spouse name: Not on file   Number of children: Not on file   Years of education: Not on file   Highest education level: Not on file   Occupational History   Not on file  Tobacco Use   Smoking status: Former    Types: Cigarettes    Quit date: 06/03/1979    Years since quitting: 43.0    Passive exposure: Past   Smokeless tobacco: Never  Vaping Use   Vaping Use: Never used  Substance and Sexual Activity   Alcohol use: Yes    Comment: wine occas   Drug use: No   Sexual activity: Yes    Birth control/protection: Surgical  Other Topics Concern   Not on file  Social History Narrative   Not on file   Social Determinants of Health   Financial Resource Strain: Not on file  Food Insecurity: Not on file  Transportation Needs: Not on file  Physical Activity: Not on file  Stress: Not on file  Social Connections: Not on file     Family History: The patient's family history is negative for Anesthesia problems, Hypotension, Malignant hyperthermia, and Pseudochol deficiency. ROS:   Please see the history of present illness.    All other systems reviewed and are negative.  EKGs/Labs/Other Studies Reviewed:    The following studies were reviewed today:  She had a myocardial perfusion study 01/03/2019 showing normal perfusion and normal function.  1. Left ventricular ejection fraction, by estimation, is 60 to 65%. The  left ventricle has normal function. The left ventricle has no regional  wall motion abnormalities. There is mild left ventricular hypertrophy.  Left ventricular diastolic parameters  were normal.   2. Right ventricular systolic function is normal. The right ventricular  size is normal. There is mildly elevated pulmonary artery systolic  pressure.   3. Left atrial size was mildly dilated.   4. The mitral valve is normal in structure. No evidence of mitral valve  regurgitation. No evidence of mitral stenosis.   5. The aortic valve is normal in structure. Aortic valve regurgitation is  not visualized. No aortic stenosis is present.   6. The inferior vena cava is normal in size with greater than 50%   respiratory variability, suggesting right atrial pressure of 3 mmHg.   Recent Labs: 12/12/2021: ALT 15; BUN 18; Creatinine, Ser 0.83; Potassium 4.0; Sodium 143  Recent Lipid Panel    Component Value Date/Time   CHOL 171 12/12/2021 1457   TRIG 112 12/12/2021 1457  HDL 53 12/12/2021 1457   CHOLHDL 3.2 12/12/2021 1457   LDLCALC 98 12/12/2021 1457    Physical Exam:    VS:  BP 116/68   Pulse 80   Ht 5' 4.6" (1.641 m)   Wt 212 lb (96.2 kg)   LMP 03/11/2011   SpO2 97%   BMI 35.72 kg/m     Wt Readings from Last 3 Encounters:  06/13/22 212 lb (96.2 kg)  12/12/21 208 lb (94.3 kg)  12/06/20 212 lb 6.4 oz (96.3 kg)     GEN:  Well nourished, well developed in no acute distress HEENT: Normal NECK: No JVD; No carotid bruits LYMPHATICS: No lymphadenopathy CARDIAC: RRR, no murmurs, rubs, gallops RESPIRATORY:  Clear to auscultation without rales, wheezing or rhonchi  ABDOMEN: Soft, non-tender, non-distended MUSCULOSKELETAL:  No edema; No deformity  SKIN: Warm and dry NEUROLOGIC:  Alert and oriented x 3 PSYCHIATRIC:  Normal affect    Signed, Shirlee More, MD  06/13/2022 11:52 AM    [place]

## 2022-08-08 ENCOUNTER — Other Ambulatory Visit: Payer: Self-pay

## 2022-08-08 MED ORDER — SPIRONOLACTONE 25 MG PO TABS: 25.0000 mg | ORAL_TABLET | Freq: Every day | ORAL | 3 refills | Status: AC

## 2022-08-08 NOTE — Telephone Encounter (Signed)
Rx for Spironolactone 25 mg # 90 x 3 sent to [facility]

## 2022-10-14 ENCOUNTER — Telehealth: Payer: Self-pay | Admitting: Cardiology

## 2022-10-14 NOTE — Telephone Encounter (Signed)
Pt c/o BP issue: STAT if pt c/o blurred vision, one-sided weakness or slurred speech  1. What are your last 5 BP readings?  4/22: 98/57 4/21: 118/71 4/20: 99/49 4/19: 134/81 4/18: 106/63  2. Are you having any other symptoms (ex. Dizziness, headache, blurred vision, passed out)?  Headaches  3. What is your BP issue?  BP has been low.

## 2022-10-14 NOTE — Telephone Encounter (Signed)
Called patient and she reported that she was having low blood pressures and headaches.. Her latest blood pressures are listed below:  4/22: 98/57 4/21: 118/71 4/20: 99/49 4/19: 134/81 4/18: 106/63  The patient has an appointment on 12/17/22 but is requesting an earlier appointment due to her low blood pressures. Please advise.

## 2022-10-16 ENCOUNTER — Other Ambulatory Visit: Payer: Self-pay

## 2022-10-16 MED ORDER — HYDRALAZINE HCL 25 MG PO TABS: 25.0000 mg | ORAL_TABLET | Freq: Three times a day (TID) | ORAL | 3 refills | Status: DC

## 2022-10-16 NOTE — Telephone Encounter (Signed)
Called patient and informed her of Dr. Hulen Shouts recommendations below:  "Please be sure that she is using the L&D blood pressure device upper extremity and good technique in his abdomen spot blood pressures  Hydralazine dose 50% 50 to 25 mg 3 times daily"   Patient was agreeable with this plan and had no further questions at this time.

## 2022-12-16 NOTE — Progress Notes (Unsigned)
Cardiology Office Note:    Date:  12/17/2022   ID:  Catherine Maxwell, DOB February 23, 1956, MRN [id number]  PCP:  Hadley Pen, MD  Cardiologist:  Norman Herrlich, MD    Referring MD: Hadley Pen, MD    ASSESSMENT:    1. Chest pain of uncertain etiology   2. Hypertensive heart disease without heart failure   3. Type 2 diabetes mellitus with complication, without long-term current use of insulin (HCC)   4. Hyperlipidemia, unspecified hyperlipidemia type    PLAN:    In order of problems listed above:  For cardiac CTA to define presence or absence of CAD severity and guide further evaluation. Stable blood pressure is at target on a multidrug regimen including centrally active clonidine loop diuretic hydralazine ARB thiazide diuretic and MRA.  Continue this regimen quite effective for resistant hypertension Stable diabetes well-controlled Continue his statin   Next appointment: 6 months   Medication Adjustments/Labs and Tests Ordered: Current medicines are reviewed at length with the patient today.  Concerns regarding medicines are outlined above.  Orders Placed This Encounter  Procedures   EKG 12-Lead   No orders of the defined types were placed in this encounter.    History of Present Illness:    Catherine Maxwell is a 67 y.o. female with a hx of type 2 diabetes hypertensive diastolic heart disease with heart failure and hyperlipidemia last seen 06/13/2022.  Chart review shows she had a myocardial perfusion study 11/03/2018 described as normal perfusion no ischemia  She had EKG in her PCP office 12/10/2022.  I cannot review it but the report in Care Everywhere says sinus rhythm consider septal infarction and marked ST abnormality.  I reviewed that note and there were complaints of chest pain although she was seen predominantly for back discomfort. Compliance with diet, lifestyle and medications: Yes  She has a good quality of life she [facility] but has  been bothered by recurrent back pain She is using her upper extremities with weights as well as an elliptical and she has noted sharp left chest discomfort waxes and wanes intensity occur at rest brief but she has a chronic chest pain pattern has had a previous myocardial perfusion study that was normal and she has an abnormal EKG. We discussed modalities and should go ahead with a cardiac CTA in [place]. Clinically assess back she has costochondral pain syndrome and I asked her to stop using weights. No edema short of breath palpitation or syncope Past Medical History:  Diagnosis Date   Acute lymphadenitis 12/16/2016   Arthritis    R knee, back problem    Atrophic vaginitis 09/27/2019   Benign essential hypertension 12/16/2016   Formatting of this note might be different from the original. followed by cardiology,  stable on current meds,  continue DASH, ADA diet Formatting of this note might be different from the original. Overview:  stress test- in past recent - [facility]. [place]   //   renal artery Doppler May, 2015, normal renal arteries, no renal artery stenosis, aorta and iliacs normal.  Last Assessment & Pla   Bradycardia, sinus 10/27/2013   Sinus bradycardia, EKG, Oct 27, 2013    Cervical radiculopathy 05/16/2017   Complication of anesthesia    panicky before anesthesia    Congestion of upper airway 05/16/2017   Diabetes (HCC) 10/26/2013   History of diabetes, type II, no mention of complication    Ejection fraction    Erysipelas 05/27/2019  Essential hypertension    stress test- in past recent - [facility]. [place]   //   renal artery Doppler May, 2015, normal renal arteries, no renal artery stenosis, aorta and iliacs normal.    GERD (gastroesophageal reflux disease)    recent reflux, took OTC tx.   Hair loss 12/16/2016   Headache(784.0)    uses RX- unsure of name   Hematuria 12/16/2016   History of migraine 12/16/2016   Hypertension    stress test- in past  recent - [facility]. [place]    Hypertensive disorder 09/27/2019   Idiopathic peripheral neuropathy 01/10/2017   Knee pain, left anterior 12/16/2016   To F/U with Dr Sherlean Foot tomorrow morning-   office   Myalgia and myositis 12/16/2016   Obesity 12/16/2016   OSA (obstructive sleep apnea) 02/22/2019   Other constipation 12/16/2016   Pelvic pain in female 12/16/2016   Recurrent upper respiratory infection (URI)    recent cough, treated /w OTC   S/P lumbar laminectomy 11/12/2017   Screening for colon cancer 02/16/2019   Sinusitis 12/16/2016   Status post bilateral knee replacements 09/17/2016   Subdeltoid bursitis, left 12/16/2016   Trigger finger of both hands 12/16/2016   Trigger finger of right thumb 08/14/2017   Trigger middle finger of right hand 08/14/2017   Type 2 diabetes mellitus with hyperglycemia, without long-term current use of insulin (HCC) 12/16/2016   Formatting of this note might be different from the original. at goal   Vaginitis and vulvovaginitis 12/16/2016   Vertigo 12/16/2016   RH PT evaluate and treat   Vitamin D deficiency 12/16/2016   Wheezing 12/16/2016   Wheezy bronchitis 12/16/2016   Yeast infection of the skin 03/07/2017    Past Surgical History:  Procedure Laterality Date   CESAREAN SECTION     x2   JOINT REPLACEMENT     2005- L knee, arthroscopic procedure both knees    SUPERIOR OBLIQUE TUCK     tummy tuck - 1986   TOTAL KNEE ARTHROPLASTY  06/10/2011   Procedure: TOTAL KNEE ARTHROPLASTY;  Surgeon: Raymon Mutton, MD;  Location: [facility] OR;  Service: Orthopedics;  Laterality: Right;   TUBAL LIGATION      Current Medications: Current Meds  Medication Sig   aspirin EC 81 MG tablet Take 81 mg by mouth daily.   atorvastatin (LIPITOR) 20 MG tablet Take 20 mg by mouth daily.   CINNAMON PO Take 1 tablet by mouth daily.     cloNIDine (CATAPRES) 0.3 MG tablet Take 0.3 mg by mouth 2 (two) times daily.   furosemide (LASIX) 20 MG tablet Take  20 mg by mouth every Monday, Wednesday, and Friday.   hydrALAZINE (APRESOLINE) 25 MG tablet Take 1 tablet (25 mg total) by mouth 3 (three) times daily. (Patient taking differently: Take 12.5 mg by mouth 3 (three) times daily.)   irbesartan-hydrochlorothiazide (AVALIDE) 300-12.5 MG tablet Take 1 tablet by mouth daily.   methocarbamol (ROBAXIN) 750 MG tablet Take 750 mg by mouth 3 (three) times daily as needed for muscle spasms.   OZEMPIC, 0.25 OR 0.5 MG/DOSE, 2 MG/3ML SOPN Inject 0.5 mg into the skin once a week.   spironolactone (ALDACTONE) 25 MG tablet Take 1 tablet (25 mg total) by mouth daily.   [DISCONTINUED] glyBURIDE (DIABETA) 5 MG tablet Take 5 mg by mouth in the morning and at bedtime.     Allergies:   Celebrex [celecoxib] and Morphine and codeine   Social History   Socioeconomic History  Marital status: Married    Spouse name: Not on file   Number of children: Not on file   Years of education: Not on file   Highest education level: Not on file  Occupational History   Not on file  Tobacco Use   Smoking status: Former    Types: Cigarettes    Quit date: 06/03/1979    Years since quitting: 43.5    Passive exposure: Past   Smokeless tobacco: Never  Vaping Use   Vaping Use: Never used  Substance and Sexual Activity   Alcohol use: Yes    Comment: wine occas   Drug use: No   Sexual activity: Yes    Birth control/protection: Surgical  Other Topics Concern   Not on file  Social History Narrative   Not on file   Social Determinants of Health   Financial Resource Strain: Not on file  Food Insecurity: Not on file  Transportation Needs: Not on file  Physical Activity: Not on file  Stress: Not on file  Social Connections: Not on file       EKGs/Labs/Other Studies Reviewed:    The following studies were reviewed today:  Cardiac Studies & Procedures     STRESS TESTS  MYOCARDIAL PERFUSION IMAGING 11/03/2018  Narrative  The left ventricular ejection fraction is  hyperdynamic (>65%).  Nuclear stress EF: 68%.  There was no ST segment deviation noted during stress.  The study is normal.  This is a low risk study.  Apical thinning   ECHOCARDIOGRAM  ECHOCARDIOGRAM COMPLETE 12/26/2021  Narrative ECHOCARDIOGRAM REPORT    Patient Name:   Catherine Maxwell Date of Exam: 12/26/2021 Medical Rec #:  [id number]        Height:       64.5 in Accession #:    [id number]       Weight:       208.0 lb Date of Birth:  1956/05/20        BSA:          2.000 m Patient Age:    66 years         BP:           146/84 mmHg Patient Gender: F                HR:           60 bpm. Exam Location:  [facility]  Procedure: 2D Echo, Color Doppler, Cardiac Doppler and Strain Analysis  Indications:    Hypertensive heart disease without heart failure [I11.9 (ICD-10-CM)]; Type 2 diabetes mellitus with complication, without long-term current use of insulin (HCC) [E11.8 (ICD-10-CM)]; Hyperlipidemia, unspecified hyperlipidemia type [E78.5 (ICD-10-CM)]  History:        Patient has prior history of Echocardiogram examinations, most recent 11/11/2013. Arrythmias:Bradycardia; Risk Factors:Hypertension.  Sonographer:    Margreta Journey RDCS Referring Phys: [id number] Sharena Dibenedetto J Adyan Palau  IMPRESSIONS   1. Left ventricular ejection fraction, by estimation, is 60 to 65%. The left ventricle has normal function. The left ventricle has no regional wall motion abnormalities. There is mild left ventricular hypertrophy. Left ventricular diastolic parameters were normal. 2. Right ventricular systolic function is normal. The right ventricular size is normal. There is mildly elevated pulmonary artery systolic pressure. 3. Left atrial size was mildly dilated. 4. The mitral valve is normal in structure. No evidence of mitral valve regurgitation. No evidence of mitral stenosis. 5. The aortic valve is normal in structure. Aortic valve regurgitation is not visualized. No aortic stenosis is  present. 6.  The inferior vena cava is normal in size with greater than 50% respiratory variability, suggesting right atrial pressure of 3 mmHg.  FINDINGS Left Ventricle: Left ventricular ejection fraction, by estimation, is 60 to 65%. The left ventricle has normal function. The left ventricle has no regional wall motion abnormalities. The left ventricular internal cavity size was normal in size. There is mild left ventricular hypertrophy. Left ventricular diastolic parameters were normal.  Right Ventricle: The right ventricular size is normal. No increase in right ventricular wall thickness. Right ventricular systolic function is normal. There is mildly elevated pulmonary artery systolic pressure. The tricuspid regurgitant velocity is 2.93 m/s, and with an assumed right atrial pressure of 3 mmHg, the estimated right ventricular systolic pressure is 37.3 mmHg.  Left Atrium: Left atrial size was mildly dilated.  Right Atrium: Right atrial size was normal in size.  Pericardium: There is no evidence of pericardial effusion.  Mitral Valve: The mitral valve is normal in structure. No evidence of mitral valve regurgitation. No evidence of mitral valve stenosis.  Tricuspid Valve: The tricuspid valve is normal in structure. Tricuspid valve regurgitation is not demonstrated. No evidence of tricuspid stenosis.  Aortic Valve: The aortic valve is normal in structure. Aortic valve regurgitation is not visualized. No aortic stenosis is present.  Pulmonic Valve: The pulmonic valve was normal in structure. Pulmonic valve regurgitation is not visualized. No evidence of pulmonic stenosis.  Aorta: The aortic root is normal in size and structure.  Venous: The inferior vena cava is normal in size with greater than 50% respiratory variability, suggesting right atrial pressure of 3 mmHg.  IAS/Shunts: No atrial level shunt detected by color flow Doppler.   LEFT VENTRICLE PLAX 2D LVIDd:         3.90 cm    Diastology LVIDs:         2.50 cm   LV e' medial:    6.31 cm/s LV PW:         1.30 cm   LV E/e' medial:  17.7 LV IVS:        1.50 cm   LV e' lateral:   7.18 cm/s LVOT diam:     1.80 cm   LV E/e' lateral: 15.6 LV SV:         78 LV SV Index:   39 LVOT Area:     2.54 cm   RIGHT VENTRICLE             IVC RV Basal diam:  2.90 cm     IVC diam: 1.60 cm RV S prime:     13.80 cm/s TAPSE (M-mode): 2.7 cm  LEFT ATRIUM             Index        RIGHT ATRIUM           Index LA diam:        3.90 cm 1.95 cm/m   RA Area:     12.80 cm LA Vol (A2C):   58.8 ml 29.40 ml/m  RA Volume:   28.50 ml  14.25 ml/m LA Vol (A4C):   60.5 ml 30.25 ml/m LA Biplane Vol: 60.3 ml 30.15 ml/m AORTIC VALVE LVOT Vmax:   142.00 cm/s LVOT Vmean:  90.100 cm/s LVOT VTI:    0.307 m  AORTA Ao Root diam: 2.80 cm Ao Asc diam:  3.30 cm Ao Desc diam: 2.10 cm  MITRAL VALVE  TRICUSPID VALVE MV Area (PHT): 4.68 cm     TR Peak grad:   34.3 mmHg MV Decel Time: 162 msec     TR Vmax:        293.00 cm/s MV E velocity: 112.00 cm/s MV A velocity: 83.70 cm/s   SHUNTS MV E/A ratio:  1.34         Systemic VTI:  0.31 m Systemic Diam: 1.80 cm  Belva Crome MD Electronically signed by Belva Crome MD Signature Date/Time: 12/26/2021/9:47:07 AM    Final                 Recent Labs: 12/10/2022: Hemoglobin A1c 6.8 sodium 140 potassium 4.6 GFR 73 cc/min calcium mildly elevated 10 point Cholesterol 168 LDL 88 triglycerides 150 HDL 55  Physical Exam:    VS:  BP 138/82 (BP Location: Right Arm, Patient Position: Sitting)   Pulse 87   Ht 5\' 1"  (1.549 m)   Wt 206 lb 6.4 oz (93.6 kg)   LMP 03/11/2011   SpO2 98%   BMI 39.00 kg/m     Wt Readings from Last 3 Encounters:  12/17/22 206 lb 6.4 oz (93.6 kg)  06/13/22 212 lb (96.2 kg)  12/12/21 208 lb (94.3 kg)     GEN:  Well nourished, well developed in no acute distress she has chest wall tenderness left costochondral junction quite similar to her  spontaneous chest pain complaint HEENT: Normal NECK: No JVD; No carotid bruits LYMPHATICS: No lymphadenopathy CARDIAC: RRR, no murmurs, rubs, gallops RESPIRATORY:  Clear to auscultation without rales, wheezing or rhonchi  ABDOMEN: Soft, non-tender, non-distended MUSCULOSKELETAL:  No edema; No deformity  SKIN: Warm and dry NEUROLOGIC:  Alert and oriented x 3 PSYCHIATRIC:  Normal affect    Signed, Norman Herrlich, MD  12/17/2022 8:28 AM    [facility]

## 2022-12-17 ENCOUNTER — Ambulatory Visit: Payer: Medicare Other | Attending: Cardiology | Admitting: Cardiology

## 2022-12-17 ENCOUNTER — Encounter: Payer: Self-pay | Admitting: Cardiology

## 2022-12-17 VITALS — BP 138/82 | HR 87 | Ht 61.0 in | Wt 206.4 lb

## 2022-12-17 DIAGNOSIS — E785 Hyperlipidemia, unspecified: Secondary | ICD-10-CM | POA: Diagnosis not present

## 2022-12-17 DIAGNOSIS — I119 Hypertensive heart disease without heart failure: Secondary | ICD-10-CM

## 2022-12-17 DIAGNOSIS — R079 Chest pain, unspecified: Secondary | ICD-10-CM

## 2022-12-17 DIAGNOSIS — E118 Type 2 diabetes mellitus with unspecified complications: Secondary | ICD-10-CM

## 2022-12-17 DIAGNOSIS — Z7985 Long-term (current) use of injectable non-insulin antidiabetic drugs: Secondary | ICD-10-CM

## 2022-12-17 MED ORDER — METOPROLOL TARTRATE 100 MG PO TABS: 100.0000 mg | ORAL_TABLET | Freq: Once | ORAL | 0 refills | Status: DC

## 2022-12-17 NOTE — Patient Instructions (Signed)
Medication Instructions:  Your physician recommends that you continue on your current medications as directed. Please refer to the Current Medication list given to you today.  *If you need a refill on your cardiac medications before your next appointment, please call your pharmacy*   Lab Work: Your physician recommends that you return for lab work in:   Labs 1 week before CT: BMP  If you have labs (blood work) drawn today and your tests are completely normal, you will receive your results only by: MyChart Message (if you have MyChart) OR A paper copy in the mail If you have any lab test that is abnormal or we need to change your treatment, we will call you to review the results.   Testing/Procedures:   Your cardiac CT will be scheduled at one of the below locations:   [facility] [address] [place], [place] [address] [phone]  OR  [facility] [address] [place], [place] [address] [phone]  OR   [facility] [address] [place], [place] [address] [phone]  If scheduled at [facility], please arrive at the [facility] Entrance (Entrance C2) of [facility] 30 minutes prior to test start time. You can use the FREE valet parking offered at entrance C (encouraged to control the heart rate for the test)  Proceed to the [facility] Radiology Department (first floor) to check-in and test prep.  All radiology patients and guests should use entrance C2 at [facility], accessed from [facility], even though the hospital's physical address listed is [address].    If scheduled at [facility] or [facility], please arrive 15 mins early for check-in and test prep.   Please follow these instructions carefully (unless otherwise directed):  On the Night Before the Test: Be  sure to Drink plenty of water. Do not consume any caffeinated/decaffeinated beverages or chocolate 12 hours prior to your test. Do not take any antihistamines 12 hours prior to your test.  On the Day of the Test: Drink plenty of water until 1 hour prior to the test. Do not eat any food 1 hour prior to test. You may take your regular medications prior to the test.  Take metoprolol (Lopressor) two hours prior to test. If you take Furosemide/Spironolactone, please HOLD on the morning of the test. FEMALES- please wear underwire-free bra if available, avoid dresses & tight clothing      After the Test: Drink plenty of water. After receiving IV contrast, you may experience a mild flushed feeling. This is normal. On occasion, you may experience a mild rash up to 24 hours after the test. This is not dangerous. If this occurs, you can take Benadryl 25 mg and increase your fluid intake. If you experience trouble breathing, this can be serious. If it is severe call 911 IMMEDIATELY. If it is mild, please call our office.  We will call to schedule your test 2-4 weeks out understanding that some insurance companies will need an authorization prior to the service being performed.   For more information and frequently asked questions, please visit our website : [email or web address]  For non-scheduling related questions, please contact the cardiac imaging nurse navigator should you have any questions/concerns: Rockwell [place], Cardiac Imaging Nurse Navigator Larey Brick, Cardiac Imaging Nurse Navigator [facility] Direct Office Dial: [phone]   For scheduling needs, including  cancellations and rescheduling, please call [place], [phone].    Follow-Up: At [facility], you and your health needs are our priority.  As part of our continuing mission to provide you with exceptional heart care, we have created designated Provider Care Teams.  These  Care Teams include your primary Cardiologist (physician) and Advanced Practice Providers (APPs -  Physician Assistants and Nurse Practitioners) who all work together to provide you with the care you need, when you need it.  We recommend signing up for the patient portal called "MyChart".  Sign up information is provided on this After Visit Summary.  MyChart is used to connect with patients for Virtual Visits (Telemedicine).  Patients are able to view lab/test results, encounter notes, upcoming appointments, etc.  Non-urgent messages can be sent to your provider as well.   To learn more about what you can do with MyChart, go to [email or web address].    Your next appointment:   6 month(s)  Provider:   Norman Herrlich, MD    Other Instructions None

## 2022-12-19 ENCOUNTER — Telehealth: Payer: Self-pay | Admitting: Cardiology

## 2022-12-19 NOTE — Telephone Encounter (Signed)
Pt called in stating she was told to have lab order done 1 week prior to CT. Her CT is scheduled for 7.10.24 however she will be gone the week before. She states the CT scheduler told her, her labs from 12/10/22 with PCP are fine to use but she wants to be sure that Dr. Dulce Sellar is okay with that. Please advise.

## 2022-12-19 NOTE — Telephone Encounter (Signed)
Pt had a normal creatine on 12/13/22. Advised that she is ok for the CT without repeat. Py verbalized understanding and had no additional questions.

## 2022-12-30 ENCOUNTER — Encounter (HOSPITAL_COMMUNITY): Payer: Self-pay

## 2022-12-30 ENCOUNTER — Other Ambulatory Visit (HOSPITAL_COMMUNITY): Payer: Medicare Other

## 2022-12-31 ENCOUNTER — Telehealth (HOSPITAL_COMMUNITY): Payer: Self-pay | Admitting: *Deleted

## 2022-12-31 MED ORDER — METOPROLOL TARTRATE 100 MG PO TABS: 100.0000 mg | ORAL_TABLET | Freq: Once | ORAL | 0 refills | Status: DC

## 2022-12-31 NOTE — Telephone Encounter (Signed)
Reaching out to patient to offer assistance regarding upcoming cardiac imaging study; pt verbalizes understanding of appt date/time, parking situation and where to check in, pre-test NPO status and medications ordered, and verified current allergies; name and call back number provided for further questions should they arise  Jahlen Bollman RN Navigator Cardiac Imaging [facility] [phone] office [phone] cell  Patient to take 100mg metoprolol tartrate two hours prior to her cardiac CT scan. She is aware to arrive at 7:30am. 

## 2023-01-01 ENCOUNTER — Ambulatory Visit (HOSPITAL_COMMUNITY): Payer: Medicare Other

## 2023-01-01 ENCOUNTER — Ambulatory Visit (HOSPITAL_COMMUNITY)
Admission: RE | Admit: 2023-01-01 | Discharge: 2023-01-01 | Disposition: A | Discharge status: Home / Self Care | Payer: Medicare Other | Source: Ambulatory Visit | Attending: Cardiology | Admitting: Cardiology

## 2023-01-01 DIAGNOSIS — I119 Hypertensive heart disease without heart failure: Secondary | ICD-10-CM | POA: Insufficient documentation

## 2023-01-01 DIAGNOSIS — E118 Type 2 diabetes mellitus with unspecified complications: Secondary | ICD-10-CM | POA: Diagnosis present

## 2023-01-01 DIAGNOSIS — E785 Hyperlipidemia, unspecified: Secondary | ICD-10-CM | POA: Diagnosis present

## 2023-01-01 DIAGNOSIS — R079 Chest pain, unspecified: Secondary | ICD-10-CM | POA: Insufficient documentation

## 2023-01-01 MED ORDER — NITROGLYCERIN 0.4 MG SL SUBL
0.8000 mg | SUBLINGUAL_TABLET | Freq: Once | SUBLINGUAL | Status: AC
  Administered 2023-01-01: 0.8 mg via SUBLINGUAL

## 2023-01-01 MED ORDER — IOHEXOL 350 MG/ML SOLN
100.0000 mL | Freq: Once | INTRAVENOUS | Status: AC | PRN
  Administered 2023-01-01: 100 mL via INTRAVENOUS

## 2023-01-01 MED ORDER — NITROGLYCERIN 0.4 MG SL SUBL
SUBLINGUAL_TABLET | SUBLINGUAL | Status: AC
  Filled 2023-01-01: qty 2

## 2023-01-03 ENCOUNTER — Other Ambulatory Visit (HOSPITAL_COMMUNITY): Payer: Medicare Other

## 2023-02-13 ENCOUNTER — Telehealth: Payer: Self-pay | Admitting: Cardiology

## 2023-02-13 NOTE — Telephone Encounter (Signed)
Pt wanted to let Dr.Munley know she just left the hospital and they sent over the paperwork to our office. Please advise

## 2023-02-13 NOTE — Telephone Encounter (Signed)
I left message telling patient we acknowledge her phone call and we await correspondence via fax.

## 2023-02-17 ENCOUNTER — Other Ambulatory Visit: Payer: Self-pay

## 2023-02-17 ENCOUNTER — Ambulatory Visit: Payer: Medicare Other | Admitting: Cardiology

## 2023-02-17 NOTE — Progress Notes (Deleted)
Cardiology Office Note:  .   Date:  02/17/2023  ID:  Delene Ruffini, DOB 15-Feb-1956, MRN [id number] PCP: Hadley Pen, MD  [facility] Providers Cardiologist:  None { Click to update primary MD,subspecialty MD or APP then REFRESH:1}   History of Present Illness: .   Catherine Maxwell is a 67 y.o. female with a past medical history of hypertension, bradycardia, OSA, GERD, DM 2, dyslipidemia, PFO.  01/06/2023 coronary CTA calcium score of 74.7, 81st percentile, less than 25% distal LM/ostial LAD and proximal RCA, 25 to 49% proximal LAD 12/26/2021 echo EF 60 to 65%, mild LVH, mildly elevated PASP  She presented to [facility] emergency department on 02/13/2023 with reports of left-sided chest pain, pain was worse with palpation and deep inspiration.  Troponins were negative.  ROS: ***  Studies Reviewed: .        Cardiac Studies & Procedures     STRESS TESTS  MYOCARDIAL PERFUSION IMAGING 11/03/2018  Narrative  The left ventricular ejection fraction is hyperdynamic (>65%).  Nuclear stress EF: 68%.  There was no ST segment deviation noted during stress.  The study is normal.  This is a low risk study.  Apical thinning   ECHOCARDIOGRAM  ECHOCARDIOGRAM COMPLETE 12/26/2021  Narrative ECHOCARDIOGRAM REPORT    Patient Name:   HAJAR FABRIZIO Date of Exam: 12/26/2021 Medical Rec #:  [id number]        Height:       64.5 in Accession #:    [id number]       Weight:       208.0 lb Date of Birth:  1955-10-31        BSA:          2.000 m Patient Age:    66 years         BP:           146/84 mmHg Patient Gender: F                HR:           60 bpm. Exam Location:  [place]  Procedure: 2D Echo, Color Doppler, Cardiac Doppler and Strain Analysis  Indications:    Hypertensive heart disease without heart failure [I11.9 (ICD-10-CM)]; Type 2 diabetes mellitus with complication, without long-term current use of insulin (HCC) [E11.8 (ICD-10-CM)]; Hyperlipidemia,  unspecified hyperlipidemia type [E78.5 (ICD-10-CM)]  History:        Patient has prior history of Echocardiogram examinations, most recent 11/11/2013. Arrythmias:Bradycardia; Risk Factors:Hypertension.  Sonographer:    Margreta Journey RDCS Referring Phys: [id number] BRIAN J MUNLEY  IMPRESSIONS   1. Left ventricular ejection fraction, by estimation, is 60 to 65%. The left ventricle has normal function. The left ventricle has no regional wall motion abnormalities. There is mild left ventricular hypertrophy. Left ventricular diastolic parameters were normal. 2. Right ventricular systolic function is normal. The right ventricular size is normal. There is mildly elevated pulmonary artery systolic pressure. 3. Left atrial size was mildly dilated. 4. The mitral valve is normal in structure. No evidence of mitral valve regurgitation. No evidence of mitral stenosis. 5. The aortic valve is normal in structure. Aortic valve regurgitation is not visualized. No aortic stenosis is present. 6. The inferior vena cava is normal in size with greater than 50% respiratory variability, suggesting right atrial pressure of 3 mmHg.  FINDINGS Left Ventricle: Left ventricular ejection fraction, by estimation, is 60 to 65%. The left ventricle has normal function. The left ventricle has no regional  wall motion abnormalities. The left ventricular internal cavity size was normal in size. There is mild left ventricular hypertrophy. Left ventricular diastolic parameters were normal.  Right Ventricle: The right ventricular size is normal. No increase in right ventricular wall thickness. Right ventricular systolic function is normal. There is mildly elevated pulmonary artery systolic pressure. The tricuspid regurgitant velocity is 2.93 m/s, and with an assumed right atrial pressure of 3 mmHg, the estimated right ventricular systolic pressure is 37.3 mmHg.  Left Atrium: Left atrial size was mildly dilated.  Right Atrium:  Right atrial size was normal in size.  Pericardium: There is no evidence of pericardial effusion.  Mitral Valve: The mitral valve is normal in structure. No evidence of mitral valve regurgitation. No evidence of mitral valve stenosis.  Tricuspid Valve: The tricuspid valve is normal in structure. Tricuspid valve regurgitation is not demonstrated. No evidence of tricuspid stenosis.  Aortic Valve: The aortic valve is normal in structure. Aortic valve regurgitation is not visualized. No aortic stenosis is present.  Pulmonic Valve: The pulmonic valve was normal in structure. Pulmonic valve regurgitation is not visualized. No evidence of pulmonic stenosis.  Aorta: The aortic root is normal in size and structure.  Venous: The inferior vena cava is normal in size with greater than 50% respiratory variability, suggesting right atrial pressure of 3 mmHg.  IAS/Shunts: No atrial level shunt detected by color flow Doppler.   LEFT VENTRICLE PLAX 2D LVIDd:         3.90 cm   Diastology LVIDs:         2.50 cm   LV e' medial:    6.31 cm/s LV PW:         1.30 cm   LV E/e' medial:  17.7 LV IVS:        1.50 cm   LV e' lateral:   7.18 cm/s LVOT diam:     1.80 cm   LV E/e' lateral: 15.6 LV SV:         78 LV SV Index:   39 LVOT Area:     2.54 cm   RIGHT VENTRICLE             IVC RV Basal diam:  2.90 cm     IVC diam: 1.60 cm RV S prime:     13.80 cm/s TAPSE (M-mode): 2.7 cm  LEFT ATRIUM             Index        RIGHT ATRIUM           Index LA diam:        3.90 cm 1.95 cm/m   RA Area:     12.80 cm LA Vol (A2C):   58.8 ml 29.40 ml/m  RA Volume:   28.50 ml  14.25 ml/m LA Vol (A4C):   60.5 ml 30.25 ml/m LA Biplane Vol: 60.3 ml 30.15 ml/m AORTIC VALVE LVOT Vmax:   142.00 cm/s LVOT Vmean:  90.100 cm/s LVOT VTI:    0.307 m  AORTA Ao Root diam: 2.80 cm Ao Asc diam:  3.30 cm Ao Desc diam: 2.10 cm  MITRAL VALVE                TRICUSPID VALVE MV Area (PHT): 4.68 cm     TR Peak grad:   34.3  mmHg MV Decel Time: 162 msec     TR Vmax:        293.00 cm/s MV E velocity: 112.00 cm/s MV A velocity:  83.70 cm/s   SHUNTS MV E/A ratio:  1.34         Systemic VTI:  0.31 m Systemic Diam: 1.80 cm  Belva Crome MD Electronically signed by Belva Crome MD Signature Date/Time: 12/26/2021/9:47:07 AM    Final     CT SCANS  CT CORONARY MORPH W/CTA COR W/SCORE 01/01/2023  Addendum 01/06/2023 12:26 PM ADDENDUM REPORT: 01/06/2023 12:23  EXAM: OVER-READ INTERPRETATION  CT CHEST  The following report is an over-read performed by radiologist Dr. Joen [facility] on 01/06/2023. This over-read does not include interpretation of cardiac or coronary anatomy or pathology. The coronary CTA interpretation by the cardiologist is attached.  COMPARISON:  06/05/2011  FINDINGS: Mediastinum: Visualized portions of the trachea and esophagus are unremarkable. Small hiatal hernia.  Lungs: No acute pleural or parenchymal lung disease.  Upper abdomen: No acute findings.  Musculoskeletal: No acute or destructive bony lesions. Reconstructed images demonstrate no additional findings.  IMPRESSION: 1. No acute or significant noncardiac findings.   Electronically Signed By: Sharlet Salina M.D. On: 01/06/2023 12:23  Narrative CLINICAL DATA:  Chest pain  EXAM: Cardiac/Coronary CTA  TECHNIQUE: A non-contrast, gated CT scan was obtained with axial slices of 3 mm through the heart for calcium scoring. Calcium scoring was performed using the Agatston method. A 120 kV prospective, gated, contrast cardiac scan was obtained. Gantry rotation speed was 250 msecs and collimation was 0.6 mm. Two sublingual nitroglycerin tablets (0.8 mg) were given. The 3D data set was reconstructed in 5% intervals of the 35-75% of the R-R cycle. Diastolic phases were analyzed on a dedicated workstation using MPR, MIP, and VRT modes. The patient received 95 cc of contrast.  FINDINGS: Image  quality: Excellent.  Noise artifact is: Limited.  Coronary Arteries:  Normal coronary origin.  Right dominance.  Left main: The left main is a large caliber vessel with a normal take off from the left coronary cusp that bifurcates to form a left anterior descending artery and a left circumflex artery. There is minimal calcified plaque in the distal LM into the ostial LAD with associated stenosis of < 25%.  Left anterior descending artery: The LAD gives off 1 patent diagonal branch. There is minimal calcified plaque in the ostial LAD with associated stenosis of <25%. There is mild mixed plaque in the proximal LAD with associated stenosis of 25-49%.  Left circumflex artery: The LCX is non-dominant and patent with no evidence of plaque or stenosis. The LCX gives off 3 patent obtuse marginal branches.  Right coronary artery: The RCA is dominant with normal take off from the right coronary cusp. There is minimal calcified plaque in the proximal RCA with associated stenosis of <25%. The RCA terminates as a PDA and right posterolateral branch without evidence of plaque or stenosis.  Right Atrium: Right atrial size is within normal limits.  Right Ventricle: The right ventricular cavity is within normal limits.  Left Atrium: Left atrial size is normal in size with no left atrial appendage filling defect.  Left Ventricle: The ventricular cavity size is within normal limits.  Pulmonary arteries: Normal in size.  Pulmonary veins: Normal pulmonary venous drainage.  Pericardium: Normal thickness without significant effusion or calcium present.  Cardiac valves: The aortic valve is trileaflet without significant calcification. The mitral valve is normal without significant calcification.  Aorta: Normal caliber without significant disease.  Small PFO.  Extra-cardiac findings: See attached radiology report for non-cardiac structures.  IMPRESSION: 1. Coronary calcium score of  74.7. This  was 81st percentile for age-, sex, and race-matched controls.  2. Total plaque volume 69 mm3 which is 29th percentile for age- and sex-matched controls (calcified plaque 58mm3; non-calcified plaque 96mm3). TPV is mild.  3. Normal coronary origin with right dominance.  4. Mild atherosclerosis (<25% distal LM/ostial LAD and proximal RCA, 25-49% proximal LAD).  5. Recommend prevent therapy and risk factor modification.  5. Consider non atherosclerotic causes of chest pain.  6. Small PFO  RECOMMENDATIONS: 1. CAD-RADS 0: No evidence of CAD (0%). Consider non-atherosclerotic causes of chest pain.  2. CAD-RADS 1: Minimal non-obstructive CAD (0-24%). Consider non-atherosclerotic causes of chest pain. Consider preventive therapy and risk factor modification.  3. CAD-RADS 2: Mild non-obstructive CAD (25-49%). Consider non-atherosclerotic causes of chest pain. Consider preventive therapy and risk factor modification.  4. CAD-RADS 3: Moderate stenosis. Consider symptom-guided anti-ischemic pharmacotherapy as well as risk factor modification per guideline directed care. Additional analysis with CT FFR will be submitted.  5. CAD-RADS 4: Severe stenosis. (70-99% or > 50% left main). Cardiac catheterization or CT FFR is recommended. Consider symptom-guided anti-ischemic pharmacotherapy as well as risk factor modification per guideline directed care. Invasive coronary angiography recommended with revascularization per published guideline statements.  6. CAD-RADS 5: Total coronary occlusion (100%). Consider cardiac catheterization or viability assessment. Consider symptom-guided anti-ischemic pharmacotherapy as well as risk factor modification per guideline directed care.  7. CAD-RADS N: Non-diagnostic study. Obstructive CAD can't be excluded. Alternative evaluation is recommended.  Armanda Magic, MD  Electronically Signed: By: Armanda Magic M.D. On: 01/01/2023 09:47           Risk Assessment/Calculations:   {Does this patient have ATRIAL FIBRILLATION?:[phone]} No BP recorded.  {Refresh Note OR Click here to enter BP  :1}***       Physical Exam:   VS:  LMP 03/11/2011    Wt Readings from Last 3 Encounters:  12/17/22 206 lb 6.4 oz (93.6 kg)  06/13/22 212 lb (96.2 kg)  12/12/21 208 lb (94.3 kg)    GEN: Well nourished, well developed in no acute distress NECK: No JVD; No carotid bruits CARDIAC: ***RRR, no murmurs, rubs, gallops RESPIRATORY:  Clear to auscultation without rales, wheezing or rhonchi  ABDOMEN: Soft, non-tender, non-distended EXTREMITIES:  No edema; No deformity   ASSESSMENT AND PLAN: .   Coronary artery disease-nonobstructive per coronary CTA in July 2024 Atypical chest pain- Dyslipidemia-most recent LDL was elevated at 98 on 1/61/0960,    {Are you ordering a CV Procedure (e.g. stress test, cath, DCCV, TEE, etc)?   Press F2        :[id number]}  Dispo: ***  Signed, Flossie Dibble, NP

## 2023-02-20 NOTE — Progress Notes (Signed)
Cardiology Office Note:    Date:  02/21/2023   ID:  Catherine Maxwell, DOB [birth year], MRN [id number]  PCP:  Hadley Pen, MD  Cardiologist:  Norman Herrlich, MD    Referring MD: Hadley Pen, MD    ASSESSMENT:    1. Mild CAD   2. Hypertensive heart disease without heart failure   3. Hyperlipidemia, unspecified hyperlipidemia type    PLAN:    In order of problems listed above:  We reviewed her cardiac CTA she has a high calcium score and mild nonobstructive CAD Her recent presentation was not ischemic in nature and is resolved Pressure is very nicely controlled on a combination including centrally active clonidine loop diuretic hydralazine ARB thiazide diuretic and MRA Continue her lipid-lowering therapy with a high intensity statin she is due for a lipid profile ordered today   Next appointment: 6 months   Medication Adjustments/Labs and Tests Ordered: Current medicines are reviewed at length with the patient today.  Concerns regarding medicines are outlined above.  No orders of the defined types were placed in this encounter.  No orders of the defined types were placed in this encounter.    History of Present Illness:    Catherine Maxwell is a 67 y.o. female with a hx of type 2 diabetes hypertension with diastolic heart failure hyperlipidemia and chest pain last seen 12/17/2022.  On that visit she had a cardiac CTA reported 01/01/2023 with a coronary score of 75 84th percentile mild CAD less than 25% distal left main ostial LAD and proximal right coronary artery 25 to 49% stenosis proximal LAD.  She was seen at [facility] ED 02/13/2023 complaining of chest pain described as pleuritic and associated with chest wall tenderness with a notation that [facility]  CBC was normal hemoglobin 14.5 BMP was normal potassium 4.0 creatinine 0.8 troponin was assessed negative proBNP level was very low she was described as having chest wall pain was  discharged in the emergency room and told to see cardiology as soon as possible.  Compliance with diet, lifestyle and medications: Yes  She changed her exercise routine dropped weight since she has had resolution of her left shoulder and left chest discomfort that clearly was musculoskeletal and noncardiac She found the whole experience upsetting She is having no exertional chest pain shortness of breath palpitation or syncope Home blood pressure averages 1 20-1 30s systolic 70-80 She tolerates her lipid-lowering therapy without muscle pain or weakness Past Medical History:  Diagnosis Date   Abdominal bloating 12/16/2016   Acute lymphadenitis 12/16/2016   Acute pain of left shoulder 05/16/2017   Arthritis    R knee, back problem    Atrophic vaginitis 09/27/2019   Benign essential hypertension 12/16/2016   Formatting of this note might be different from the original. followed by cardiology,  stable on current meds,  continue DASH, ADA diet Formatting of this note might be different from the original. Overview:  stress test- in past recent - [facility]. [place]   //   renal artery Doppler May, 2015, normal renal arteries, no renal artery stenosis, aorta and iliacs normal.  Last Assessment & Pla   Bradycardia, sinus 10/27/2013   Sinus bradycardia, EKG, Oct 27, 2013    Cervical radiculopathy 05/16/2017   Chest discomfort 06/30/2017   Chronic back pain 05/14/2017   Complication of anesthesia    panicky before anesthesia    Congestion of upper airway 05/16/2017   Cough 12/16/2016  Diabetes (HCC) 10/26/2013   History of diabetes, type II, no mention of complication    Easy fatigability 12/16/2016   Ejection fraction    Environmental and seasonal allergies 10/06/2018   Erysipelas 05/27/2019   Essential hypertension    stress test- in past recent - [facility]. [place]   //   renal artery Doppler May, 2015, normal renal arteries, no renal artery stenosis, aorta and iliacs normal.     Foot pain 12/16/2016   Plantar fasciits and has heel spur on xray. I showed pt the spur.     GERD (gastroesophageal reflux disease)    recent reflux, took OTC tx.   H/O cardiovascular stress test 10/26/2013   Nuclear medicine stress test September, 2013, done elsewhere, no ischemia, normal wall motion, EF 80%, no EKG change     Hair loss 12/16/2016   Headache 12/16/2016   Headache(784.0)    uses RX- unsure of name   Hematuria 12/16/2016   History of migraine 12/16/2016   Hypertension    stress test- in past recent - [facility]. [place]    Hypertensive disorder 09/27/2019   Idiopathic peripheral neuropathy 01/10/2017   Knee pain, left anterior 12/16/2016   To F/U with Dr Sherlean Foot tomorrow morning-  [facility] office   Melanocytic nevus of trunk 02/20/2020   Mixed dyslipidemia 01/10/2017   Myalgia and myositis 12/16/2016   Obesity 12/16/2016   OSA (obstructive sleep apnea) 02/22/2019   Other constipation 12/16/2016   Pelvic pain in female 12/16/2016   Recurrent upper respiratory infection (URI)    recent cough, treated /w OTC   S/P lumbar laminectomy 11/12/2017   Screening for colon cancer 02/16/2019   Sinusitis 12/16/2016   Status post bilateral knee replacements 09/17/2016   Strain of right trapezius muscle 02/20/2021   Subdeltoid bursitis, left 12/16/2016   Trigger finger of both hands 12/16/2016   Trigger finger of right thumb 08/14/2017   Trigger middle finger of right hand 08/14/2017   Type 2 diabetes mellitus with hyperglycemia, without long-term current use of insulin (HCC) 12/16/2016   Formatting of this note might be different from the original. at goal   Vaginitis and vulvovaginitis 12/16/2016   Vertigo 12/16/2016   RH PT evaluate and treat   Vitamin D deficiency 12/16/2016   Wheezing 12/16/2016   Wheezy bronchitis 12/16/2016   Yeast infection of the skin 03/07/2017    Current Medications: Current Meds  Medication Sig   Ascorbic Acid (VITAMIN C) 1000  MG tablet Take 1,000 mg by mouth daily.   aspirin EC 81 MG tablet Take 81 mg by mouth daily.   atorvastatin (LIPITOR) 20 MG tablet Take 20 mg by mouth daily.   calcium carbonate (TUMS - DOSED IN MG ELEMENTAL CALCIUM) 500 MG chewable tablet Chew 1 tablet by mouth daily.   CINNAMON PO Take 1 tablet by mouth daily.     cloNIDine (CATAPRES) 0.1 MG tablet Take 0.1 mg by mouth 2 (two) times daily.   furosemide (LASIX) 20 MG tablet Take 20 mg by mouth every Monday, Wednesday, and Friday.   hydrALAZINE (APRESOLINE) 50 MG tablet Take 50 mg by mouth 3 (three) times daily.   irbesartan-hydrochlorothiazide (AVALIDE) 300-12.5 MG tablet Take 1 tablet by mouth daily.   meloxicam (MOBIC) 15 MG tablet Take 15 mg by mouth daily as needed for pain.   spironolactone (ALDACTONE) 25 MG tablet Take 1 tablet (25 mg total) by mouth daily.      EKGs/Labs/Other Studies Reviewed:    The following studies  were reviewed today:  Cardiac Studies & Procedures     STRESS TESTS  MYOCARDIAL PERFUSION IMAGING 11/03/2018  Narrative  The left ventricular ejection fraction is hyperdynamic (>65%).  Nuclear stress EF: 68%.  There was no ST segment deviation noted during stress.  The study is normal.  This is a low risk study.  Apical thinning   ECHOCARDIOGRAM  ECHOCARDIOGRAM COMPLETE 12/26/2021  Narrative ECHOCARDIOGRAM REPORT    Patient Name:   ANNETT DUNNAVANT Date of Exam: 12/26/2021 Medical Rec #:  [id number]        Height:       64.5 in Accession #:    [id number]       Weight:       208.0 lb Date of Birth:  17-Feb-1956        BSA:          2.000 m Patient Age:    66 years         BP:           146/84 mmHg Patient Gender: F                HR:           60 bpm. Exam Location:  [place]  Procedure: 2D Echo, Color Doppler, Cardiac Doppler and Strain Analysis  Indications:    Hypertensive heart disease without heart failure [I11.9 (ICD-10-CM)]; Type 2 diabetes mellitus with complication, without long-term  current use of insulin (HCC) [E11.8 (ICD-10-CM)]; Hyperlipidemia, unspecified hyperlipidemia type [E78.5 (ICD-10-CM)]  History:        Patient has prior history of Echocardiogram examinations, most recent 11/11/2013. Arrythmias:Bradycardia; Risk Factors:Hypertension.  Sonographer:    Margreta Journey RDCS Referring Phys: [id number] Lenah Messenger J Deserea Bordley  IMPRESSIONS   1. Left ventricular ejection fraction, by estimation, is 60 to 65%. The left ventricle has normal function. The left ventricle has no regional wall motion abnormalities. There is mild left ventricular hypertrophy. Left ventricular diastolic parameters were normal. 2. Right ventricular systolic function is normal. The right ventricular size is normal. There is mildly elevated pulmonary artery systolic pressure. 3. Left atrial size was mildly dilated. 4. The mitral valve is normal in structure. No evidence of mitral valve regurgitation. No evidence of mitral stenosis. 5. The aortic valve is normal in structure. Aortic valve regurgitation is not visualized. No aortic stenosis is present. 6. The inferior vena cava is normal in size with greater than 50% respiratory variability, suggesting right atrial pressure of 3 mmHg.  FINDINGS Left Ventricle: Left ventricular ejection fraction, by estimation, is 60 to 65%. The left ventricle has normal function. The left ventricle has no regional wall motion abnormalities. The left ventricular internal cavity size was normal in size. There is mild left ventricular hypertrophy. Left ventricular diastolic parameters were normal.  Right Ventricle: The right ventricular size is normal. No increase in right ventricular wall thickness. Right ventricular systolic function is normal. There is mildly elevated pulmonary artery systolic pressure. The tricuspid regurgitant velocity is 2.93 m/s, and with an assumed right atrial pressure of 3 mmHg, the estimated right ventricular systolic pressure is 37.3  mmHg.  Left Atrium: Left atrial size was mildly dilated.  Right Atrium: Right atrial size was normal in size.  Pericardium: There is no evidence of pericardial effusion.  Mitral Valve: The mitral valve is normal in structure. No evidence of mitral valve regurgitation. No evidence of mitral valve stenosis.  Tricuspid Valve: The tricuspid valve is normal in structure. Tricuspid valve regurgitation is not demonstrated.  No evidence of tricuspid stenosis.  Aortic Valve: The aortic valve is normal in structure. Aortic valve regurgitation is not visualized. No aortic stenosis is present.  Pulmonic Valve: The pulmonic valve was normal in structure. Pulmonic valve regurgitation is not visualized. No evidence of pulmonic stenosis.  Aorta: The aortic root is normal in size and structure.  Venous: The inferior vena cava is normal in size with greater than 50% respiratory variability, suggesting right atrial pressure of 3 mmHg.  IAS/Shunts: No atrial level shunt detected by color flow Doppler.   LEFT VENTRICLE PLAX 2D LVIDd:         3.90 cm   Diastology LVIDs:         2.50 cm   LV e' medial:    6.31 cm/s LV PW:         1.30 cm   LV E/e' medial:  17.7 LV IVS:        1.50 cm   LV e' lateral:   7.18 cm/s LVOT diam:     1.80 cm   LV E/e' lateral: 15.6 LV SV:         78 LV SV Index:   39 LVOT Area:     2.54 cm   RIGHT VENTRICLE             IVC RV Basal diam:  2.90 cm     IVC diam: 1.60 cm RV S prime:     13.80 cm/s TAPSE (M-mode): 2.7 cm  LEFT ATRIUM             Index        RIGHT ATRIUM           Index LA diam:        3.90 cm 1.95 cm/m   RA Area:     12.80 cm LA Vol (A2C):   58.8 ml 29.40 ml/m  RA Volume:   28.50 ml  14.25 ml/m LA Vol (A4C):   60.5 ml 30.25 ml/m LA Biplane Vol: 60.3 ml 30.15 ml/m AORTIC VALVE LVOT Vmax:   142.00 cm/s LVOT Vmean:  90.100 cm/s LVOT VTI:    0.307 m  AORTA Ao Root diam: 2.80 cm Ao Asc diam:  3.30 cm Ao Desc diam: 2.10 cm  MITRAL VALVE                 TRICUSPID VALVE MV Area (PHT): 4.68 cm     TR Peak grad:   34.3 mmHg MV Decel Time: 162 msec     TR Vmax:        293.00 cm/s MV E velocity: 112.00 cm/s MV A velocity: 83.70 cm/s   SHUNTS MV E/A ratio:  1.34         Systemic VTI:  0.31 m Systemic Diam: 1.80 cm  Belva Crome MD Electronically signed by Belva Crome MD Signature Date/Time: 12/26/2021/9:47:07 AM    Final     CT SCANS  CT CORONARY MORPH W/CTA COR W/SCORE 01/01/2023  Addendum 01/06/2023 12:26 PM ADDENDUM REPORT: 01/06/2023 12:23  EXAM: OVER-READ INTERPRETATION  CT CHEST  The following report is an over-read performed by radiologist Dr. Joen Laura [facility] on 01/06/2023. This over-read does not include interpretation of cardiac or coronary anatomy or pathology. The coronary CTA interpretation by the cardiologist is attached.  COMPARISON:  06/05/2011  FINDINGS: Mediastinum: Visualized portions of the trachea and esophagus are unremarkable. Small hiatal hernia.  Lungs: No acute pleural or parenchymal lung disease.  Upper abdomen: No acute  findings.  Musculoskeletal: No acute or destructive bony lesions. Reconstructed images demonstrate no additional findings.  IMPRESSION: 1. No acute or significant noncardiac findings.   Electronically Signed By: Sharlet Salina M.D. On: 01/06/2023 12:23  Narrative CLINICAL DATA:  Chest pain  EXAM: Cardiac/Coronary CTA  TECHNIQUE: A non-contrast, gated CT scan was obtained with axial slices of 3 mm through the heart for calcium scoring. Calcium scoring was performed using the Agatston method. A 120 kV prospective, gated, contrast cardiac scan was obtained. Gantry rotation speed was 250 msecs and collimation was 0.6 mm. Two sublingual nitroglycerin tablets (0.8 mg) were given. The 3D data set was reconstructed in 5% intervals of the 35-75% of the R-R cycle. Diastolic phases were analyzed on a dedicated workstation using MPR, MIP, and  VRT modes. The patient received 95 cc of contrast.  FINDINGS: Image quality: Excellent.  Noise artifact is: Limited.  Coronary Arteries:  Normal coronary origin.  Right dominance.  Left main: The left main is a large caliber vessel with a normal take off from the left coronary cusp that bifurcates to form a left anterior descending artery and a left circumflex artery. There is minimal calcified plaque in the distal LM into the ostial LAD with associated stenosis of < 25%.  Left anterior descending artery: The LAD gives off 1 patent diagonal branch. There is minimal calcified plaque in the ostial LAD with associated stenosis of <25%. There is mild mixed plaque in the proximal LAD with associated stenosis of 25-49%.  Left circumflex artery: The LCX is non-dominant and patent with no evidence of plaque or stenosis. The LCX gives off 3 patent obtuse marginal branches.  Right coronary artery: The RCA is dominant with normal take off from the right coronary cusp. There is minimal calcified plaque in the proximal RCA with associated stenosis of <25%. The RCA terminates as a PDA and right posterolateral branch without evidence of plaque or stenosis.  Right Atrium: Right atrial size is within normal limits.  Right Ventricle: The right ventricular cavity is within normal limits.  Left Atrium: Left atrial size is normal in size with no left atrial appendage filling defect.  Left Ventricle: The ventricular cavity size is within normal limits.  Pulmonary arteries: Normal in size.  Pulmonary veins: Normal pulmonary venous drainage.  Pericardium: Normal thickness without significant effusion or calcium present.  Cardiac valves: The aortic valve is trileaflet without significant calcification. The mitral valve is normal without significant calcification.  Aorta: Normal caliber without significant disease.  Small PFO.  Extra-cardiac findings: See attached radiology report  for non-cardiac structures.  IMPRESSION: 1. Coronary calcium score of 74.7. This was 81st percentile for age-, sex, and race-matched controls.  2. Total plaque volume 69 mm3 which is 29th percentile for age- and sex-matched controls (calcified plaque 104mm3; non-calcified plaque 81mm3). TPV is mild.  3. Normal coronary origin with right dominance.  4. Mild atherosclerosis (<25% distal LM/ostial LAD and proximal RCA, 25-49% proximal LAD).  5. Recommend prevent therapy and risk factor modification.  5. Consider non atherosclerotic causes of chest pain.  6. Small PFO  RECOMMENDATIONS: 1. CAD-RADS 0: No evidence of CAD (0%). Consider non-atherosclerotic causes of chest pain.  2. CAD-RADS 1: Minimal non-obstructive CAD (0-24%). Consider non-atherosclerotic causes of chest pain. Consider preventive therapy and risk factor modification.  3. CAD-RADS 2: Mild non-obstructive CAD (25-49%). Consider non-atherosclerotic causes of chest pain. Consider preventive therapy and risk factor modification.  4. CAD-RADS 3: Moderate stenosis. Consider symptom-guided anti-ischemic pharmacotherapy as well  as risk factor modification per guideline directed care. Additional analysis with CT FFR will be submitted.  5. CAD-RADS 4: Severe stenosis. (70-99% or > 50% left main). Cardiac catheterization or CT FFR is recommended. Consider symptom-guided anti-ischemic pharmacotherapy as well as risk factor modification per guideline directed care. Invasive coronary angiography recommended with revascularization per published guideline statements.  6. CAD-RADS 5: Total coronary occlusion (100%). Consider cardiac catheterization or viability assessment. Consider symptom-guided anti-ischemic pharmacotherapy as well as risk factor modification per guideline directed care.  7. CAD-RADS N: Non-diagnostic study. Obstructive CAD can't be excluded. Alternative evaluation is recommended.  Armanda Magic,  MD  Electronically Signed: By: Armanda Magic M.D. On: 01/01/2023 09:47              Recent Labs: No results found for requested labs within last 365 days.  Recent Lipid Panel    Component Value Date/Time   CHOL 171 12/12/2021 1457   TRIG 112 12/12/2021 1457   HDL 53 12/12/2021 1457   CHOLHDL 3.2 12/12/2021 1457   LDLCALC 98 12/12/2021 1457    Physical Exam:    VS:  BP 132/78 (BP Location: Left Arm, Patient Position: Sitting, Cuff Size: Normal)   Pulse 70   Ht 5\' 4"  (1.626 m)   Wt 205 lb (93 kg)   LMP 03/11/2011   SpO2 98%   BMI 35.19 kg/m     Wt Readings from Last 3 Encounters:  02/21/23 205 lb (93 kg)  12/17/22 206 lb 6.4 oz (93.6 kg)  06/13/22 212 lb (96.2 kg)     GEN:  Well nourished, well developed in no acute distress HEENT: Normal NECK: No JVD; No carotid bruits LYMPHATICS: No lymphadenopathy CARDIAC: RRR, no murmurs, rubs, gallops RESPIRATORY:  Clear to auscultation without rales, wheezing or rhonchi  ABDOMEN: Soft, non-tender, non-distended MUSCULOSKELETAL:  No edema; No deformity  SKIN: Warm and dry NEUROLOGIC:  Alert and oriented x 3 PSYCHIATRIC:  Normal affect    Signed, Norman Herrlich, MD  02/21/2023 9:57 AM    [facility]

## 2023-02-21 ENCOUNTER — Ambulatory Visit: Payer: Medicare Other | Attending: Cardiology | Admitting: Cardiology

## 2023-02-21 ENCOUNTER — Encounter: Payer: Self-pay | Admitting: Cardiology

## 2023-02-21 VITALS — BP 132/78 | HR 70 | Ht 64.0 in | Wt 205.0 lb

## 2023-02-21 DIAGNOSIS — I119 Hypertensive heart disease without heart failure: Secondary | ICD-10-CM | POA: Diagnosis not present

## 2023-02-21 DIAGNOSIS — I2510 Atherosclerotic heart disease of native coronary artery without angina pectoris: Secondary | ICD-10-CM | POA: Diagnosis not present

## 2023-02-21 DIAGNOSIS — E785 Hyperlipidemia, unspecified: Secondary | ICD-10-CM

## 2023-02-21 NOTE — Patient Instructions (Signed)
Medication Instructions:  Your physician recommends that you continue on your current medications as directed. Please refer to the Current Medication list given to you today.  *If you need a refill on your cardiac medications before your next appointment, please call your pharmacy*   Lab Work: Your physician recommends that you return for lab work in:   Labs today: Lipids, Lpa  If you have labs (blood work) drawn today and your tests are completely normal, you will receive your results only by: MyChart Message (if you have MyChart) OR A paper copy in the mail If you have any lab test that is abnormal or we need to change your treatment, we will call you to review the results.   Testing/Procedures: None   Follow-Up: At [facility], you and your health needs are our priority.  As part of our continuing mission to provide you with exceptional heart care, we have created designated Provider Care Teams.  These Care Teams include your primary Cardiologist (physician) and Advanced Practice Providers (APPs -  Physician Assistants and Nurse Practitioners) who all work together to provide you with the care you need, when you need it.  We recommend signing up for the patient portal called "MyChart".  Sign up information is provided on this After Visit Summary.  MyChart is used to connect with patients for Virtual Visits (Telemedicine).  Patients are able to view lab/test results, encounter notes, upcoming appointments, etc.  Non-urgent messages can be sent to your provider as well.   To learn more about what you can do with MyChart, go to [email or web address].    Your next appointment:   6 month(s)  Provider:   Norman Herrlich, MD    Other Instructions None

## 2023-02-21 NOTE — Addendum Note (Signed)
Addended by: Roxanne Mins I on: 02/21/2023 10:31 AM   Modules accepted: Orders

## 2023-02-22 LAB — LIPID PANEL
Chol/HDL Ratio: 3.1 ratio (ref 0.0–4.4)
Cholesterol, Total: 150 mg/dL (ref 100–199)
HDL: 49 mg/dL (ref >39)
LDL Chol Calc (NIH): 76 mg/dL (ref 0–99)
Triglycerides: 144 mg/dL (ref 0–149)
VLDL Cholesterol Cal: 25 mg/dL (ref 5–40)

## 2023-02-22 LAB — LIPOPROTEIN A (LPA): Lipoprotein (a): 255.9 nmol/L — ABNORMAL HIGH (ref <75.0)

## 2023-03-04 ENCOUNTER — Ambulatory Visit: Payer: Medicare Other | Admitting: Cardiology

## 2023-03-10 ENCOUNTER — Telehealth: Payer: Self-pay | Admitting: Cardiology

## 2023-03-10 ENCOUNTER — Other Ambulatory Visit: Payer: Self-pay

## 2023-03-10 MED ORDER — ATORVASTATIN CALCIUM 40 MG PO TABS: 40.0000 mg | ORAL_TABLET | Freq: Every day | ORAL | 3 refills | Status: DC

## 2023-03-10 NOTE — Telephone Encounter (Signed)
Patient informed of results.  

## 2023-03-10 NOTE — Telephone Encounter (Signed)
Pt returning nurses call from earlier regarding test results. Please advise

## 2023-03-10 NOTE — Telephone Encounter (Signed)
Pt returning nurse's call. Please advise

## 2023-05-29 ENCOUNTER — Other Ambulatory Visit: Payer: Self-pay

## 2023-05-29 ENCOUNTER — Telehealth: Payer: Self-pay | Admitting: Cardiology

## 2023-05-29 MED ORDER — CLONIDINE HCL 0.1 MG PO TABS: 0.1000 mg | ORAL_TABLET | Freq: Two times a day (BID) | ORAL | 3 refills | Status: DC

## 2023-05-29 NOTE — Telephone Encounter (Signed)
Patient wants a call back to discuss letter she received regarding upcoming visit.

## 2023-05-29 NOTE — Telephone Encounter (Signed)
Called patient and schedule her 6 month follow up appointment with Dr. Dulce Sellar and refilled her clonidine. Patient had no further questions at this time.

## 2023-08-19 ENCOUNTER — Encounter: Payer: Self-pay | Admitting: Cardiology

## 2023-08-19 NOTE — Progress Notes (Unsigned)
 Cardiology Office Note:    Date:  08/20/2023   ID:  Catherine Maxwell, DOB 1956/04/27, MRN [id number]  PCP:  Hadley Pen, MD  Cardiologist:  Norman Herrlich, MD    Referring MD: Hadley Pen, MD    ASSESSMENT:    1. Mild CAD   2. Hypertensive heart disease without heart failure   3. Mixed hyperlipidemia   4. Type 2 diabetes mellitus with complication, without long-term current use of insulin (HCC)    PLAN:    In order of problems listed above:  Doing well with CAD no anginal discomfort continue her medical therapy including low-dose aspirin 81 mg coated daily atorvastatin 40 mg/day and her antihypertensives. Well-controlled she checks her blood pressure at home good technique validated device and is at target.  She will continue her minimum dose of loop diuretic ARB hydrochlorothiazide spironolactone clonidine and hydralazine.  Previously she had severely resistant hypertension Continue her statin atorvastatin 40 mg daily will check a lipid profile ApoB today Stable she tells me her last A1c 6.5-7.0    Next appointment: ( months   Medication Adjustments/Labs and Tests Ordered: Current medicines are reviewed at length with the patient today.  Concerns regarding medicines are outlined above.  Orders Placed This Encounter  Procedures   Comp Met (CMET)   Lipid Profile   Apolipoprotein B   No orders of the defined types were placed in this encounter.    History of Present Illness:    Catherine Maxwell is a 68 y.o. female with a hx of mild nonobstructive CAD from cardiac CTA 01/01/2023 hypertension and hyperlipidemia last seen 01/25/2023.  Compliance with diet, lifestyle and medications: Yes  She really takes meticulous self care of herself restrict sodium exercises [facility] monitors her blood pressure that averages 126/69 and all this has translated into good health and feeling well She has not had angina edema orthopnea palpitation or syncope He feels improved  with reduced dose of clonidine Tolerates her statin without muscle pain or weakness Most recent lipid profile 02/21/2023 LDL 76 cholesterol 159 HDL cholesterol 101. Past Medical History:  Diagnosis Date   Abdominal bloating 12/16/2016   Acute lymphadenitis 12/16/2016   Acute pain of left shoulder 05/16/2017   Arthritis    R knee, back problem    Atrophic vaginitis 09/27/2019   Benign essential hypertension 12/16/2016   Formatting of this note might be different from the original. followed by cardiology,  stable on current meds,  continue DASH, ADA diet Formatting of this note might be different from the original. Overview:  stress test- in past recent - [facility]. [place]   //   renal artery Doppler May, 2015, normal renal arteries, no renal artery stenosis, aorta and iliacs normal.  Last Assessment & Pla   Bilateral sacroiliitis (HCC) 09/04/2021   Bradycardia, sinus 10/27/2013   Sinus bradycardia, EKG, Oct 27, 2013    Cervical radiculopathy 05/16/2017   Chest discomfort 06/30/2017   Chronic back pain 05/14/2017   Complication of anesthesia    panicky before anesthesia    Congestion of upper airway 05/16/2017   Cough 12/16/2016   Diabetes (HCC) 10/26/2013   History of diabetes, type II, no mention of complication    Easy fatigability 12/16/2016   Edema of extremities 10/26/2013   March, 2015, lower extremity edema  IMO SNOMED Dx Update Oct 2024     Ejection fraction    Environmental and seasonal allergies 10/06/2018   Erysipelas 05/27/2019   Essential  hypertension    stress test- in past recent - [facility]. [place]   //   renal artery Doppler May, 2015, normal renal arteries, no renal artery stenosis, aorta and iliacs normal.    Foot pain 12/16/2016   Plantar fasciits and has heel spur on xray. I showed pt the spur.     GERD (gastroesophageal reflux disease)    recent reflux, took OTC tx.   H/O cardiovascular stress test 10/26/2013   Nuclear medicine stress test  September, 2013, done elsewhere, no ischemia, normal wall motion, EF 80%, no EKG change     Hair loss 12/16/2016   Headache 12/16/2016   Headache(784.0)    uses RX- unsure of name   Hematuria 12/16/2016   History of migraine 12/16/2016   Hypertension    stress test- in past recent - [facility]. [place]    Hypertensive disorder 09/27/2019   Idiopathic peripheral neuropathy 01/10/2017   Knee pain, left anterior 12/16/2016   To F/U with Dr Sherlean Foot tomorrow morning-  [facility] office   Melanocytic nevus of trunk 02/20/2020   Mixed dyslipidemia 01/10/2017   Myalgia and myositis 12/16/2016   Need for immunization against influenza 02/22/2019   Obesity 12/16/2016   OSA (obstructive sleep apnea) 02/22/2019   Other constipation 12/16/2016   Pelvic pain in female 12/16/2016   Recurrent upper respiratory infection (URI)    recent cough, treated /w OTC   S/P lumbar laminectomy 11/12/2017   Screening for colon cancer 02/16/2019   Sinusitis 12/16/2016   Status post bilateral knee replacements 09/17/2016   Strain of right trapezius muscle 02/20/2021   Subdeltoid bursitis, left 12/16/2016   Trigger finger of both hands 12/16/2016   Trigger finger of right thumb 08/14/2017   Trigger middle finger of right hand 08/14/2017   Type 2 diabetes mellitus with hyperglycemia, without long-term current use of insulin (HCC) 12/16/2016   Formatting of this note might be different from the original. at goal   Vaginitis and vulvovaginitis 12/16/2016   Vertigo 12/16/2016   RH PT evaluate and treat   Vitamin D deficiency 12/16/2016   Wheezing 12/16/2016   Wheezy bronchitis 12/16/2016   Yeast infection of the skin 03/07/2017    Current Medications: Current Meds  Medication Sig   ACCU-CHEK GUIDE TEST test strip 1 each by Other route 2 (two) times daily.   Ascorbic Acid (VITAMIN C) 1000 MG tablet Take 1,000 mg by mouth daily.   aspirin EC 81 MG tablet Take 81 mg by mouth daily.   calcium carbonate  (TUMS - DOSED IN MG ELEMENTAL CALCIUM) 500 MG chewable tablet Chew 1 tablet by mouth daily.   CINNAMON PO Take 1 tablet by mouth daily.     cloNIDine (CATAPRES) 0.1 MG tablet Take 1 tablet (0.1 mg total) by mouth 2 (two) times daily.   furosemide (LASIX) 20 MG tablet Take 20 mg by mouth every Monday, Wednesday, and Friday.   hydrALAZINE (APRESOLINE) 50 MG tablet Take 50 mg by mouth 3 (three) times daily.   irbesartan-hydrochlorothiazide (AVALIDE) 300-12.5 MG tablet Take 1 tablet by mouth daily.   meloxicam (MOBIC) 15 MG tablet Take 15 mg by mouth daily as needed for pain.   Semaglutide, 2 MG/DOSE, (OZEMPIC, 2 MG/DOSE,) 8 MG/3ML SOPN Inject 0.75 mLs into the skin every 7 (seven) days.   spironolactone (ALDACTONE) 25 MG tablet Take 1 tablet (25 mg total) by mouth daily.      EKGs/Labs/Other Studies Reviewed:    The following studies were reviewed today:  Cardiac Studies & Procedures   ______________________________________________________________________________________________   STRESS TESTS  MYOCARDIAL PERFUSION IMAGING 11/03/2018  Narrative  The left ventricular ejection fraction is hyperdynamic (>65%).  Nuclear stress EF: 68%.  There was no ST segment deviation noted during stress.  The study is normal.  This is a low risk study.  Apical thinning   ECHOCARDIOGRAM  ECHOCARDIOGRAM COMPLETE 12/26/2021  Narrative ECHOCARDIOGRAM REPORT    Patient Name:   OLIVENE COOKSTON Date of Exam: 12/26/2021 Medical Rec #:  [id number]        Height:       64.5 in Accession #:    [id number]       Weight:       208.0 lb Date of Birth:  [birth year]        BSA:          2.000 m Patient Age:    66 years         BP:           146/84 mmHg Patient Gender: F                HR:           60 bpm. Exam Location:  [facility]  Procedure: 2D Echo, Color Doppler, Cardiac Doppler and Strain Analysis  Indications:    Hypertensive heart disease without heart failure [I11.9 (ICD-10-CM)]; Type 2 diabetes  mellitus with complication, without long-term current use of insulin (HCC) [E11.8 (ICD-10-CM)]; Hyperlipidemia, unspecified hyperlipidemia type [E78.5 (ICD-10-CM)]  History:        Patient has prior history of Echocardiogram examinations, most recent 11/11/2013. Arrythmias:Bradycardia; Risk Factors:Hypertension.  Sonographer:    Margreta Journey RDCS Referring Phys: [id number] Joanthony Hamza J Omni Dunsworth  IMPRESSIONS   1. Left ventricular ejection fraction, by estimation, is 60 to 65%. The left ventricle has normal function. The left ventricle has no regional wall motion abnormalities. There is mild left ventricular hypertrophy. Left ventricular diastolic parameters were normal. 2. Right ventricular systolic function is normal. The right ventricular size is normal. There is mildly elevated pulmonary artery systolic pressure. 3. Left atrial size was mildly dilated. 4. The mitral valve is normal in structure. No evidence of mitral valve regurgitation. No evidence of mitral stenosis. 5. The aortic valve is normal in structure. Aortic valve regurgitation is not visualized. No aortic stenosis is present. 6. The inferior vena cava is normal in size with greater than 50% respiratory variability, suggesting right atrial pressure of 3 mmHg.  FINDINGS Left Ventricle: Left ventricular ejection fraction, by estimation, is 60 to 65%. The left ventricle has normal function. The left ventricle has no regional wall motion abnormalities. The left ventricular internal cavity size was normal in size. There is mild left ventricular hypertrophy. Left ventricular diastolic parameters were normal.  Right Ventricle: The right ventricular size is normal. No increase in right ventricular wall thickness. Right ventricular systolic function is normal. There is mildly elevated pulmonary artery systolic pressure. The tricuspid regurgitant velocity is 2.93 m/s, and with an assumed right atrial pressure of 3 mmHg, the estimated right  ventricular systolic pressure is 37.3 mmHg.  Left Atrium: Left atrial size was mildly dilated.  Right Atrium: Right atrial size was normal in size.  Pericardium: There is no evidence of pericardial effusion.  Mitral Valve: The mitral valve is normal in structure. No evidence of mitral valve regurgitation. No evidence of mitral valve stenosis.  Tricuspid Valve: The tricuspid valve is normal in structure. Tricuspid valve regurgitation is not demonstrated. No evidence of  tricuspid stenosis.  Aortic Valve: The aortic valve is normal in structure. Aortic valve regurgitation is not visualized. No aortic stenosis is present.  Pulmonic Valve: The pulmonic valve was normal in structure. Pulmonic valve regurgitation is not visualized. No evidence of pulmonic stenosis.  Aorta: The aortic root is normal in size and structure.  Venous: The inferior vena cava is normal in size with greater than 50% respiratory variability, suggesting right atrial pressure of 3 mmHg.  IAS/Shunts: No atrial level shunt detected by color flow Doppler.   LEFT VENTRICLE PLAX 2D LVIDd:         3.90 cm   Diastology LVIDs:         2.50 cm   LV e' medial:    6.31 cm/s LV PW:         1.30 cm   LV E/e' medial:  17.7 LV IVS:        1.50 cm   LV e' lateral:   7.18 cm/s LVOT diam:     1.80 cm   LV E/e' lateral: 15.6 LV SV:         78 LV SV Index:   39 LVOT Area:     2.54 cm   RIGHT VENTRICLE             IVC RV Basal diam:  2.90 cm     IVC diam: 1.60 cm RV S prime:     13.80 cm/s TAPSE (M-mode): 2.7 cm  LEFT ATRIUM             Index        RIGHT ATRIUM           Index LA diam:        3.90 cm 1.95 cm/m   RA Area:     12.80 cm LA Vol (A2C):   58.8 ml 29.40 ml/m  RA Volume:   28.50 ml  14.25 ml/m LA Vol (A4C):   60.5 ml 30.25 ml/m LA Biplane Vol: 60.3 ml 30.15 ml/m AORTIC VALVE LVOT Vmax:   142.00 cm/s LVOT Vmean:  90.100 cm/s LVOT VTI:    0.307 m  AORTA Ao Root diam: 2.80 cm Ao Asc diam:  3.30 cm Ao Desc  diam: 2.10 cm  MITRAL VALVE                TRICUSPID VALVE MV Area (PHT): 4.68 cm     TR Peak grad:   34.3 mmHg MV Decel Time: 162 msec     TR Vmax:        293.00 cm/s MV E velocity: 112.00 cm/s MV A velocity: 83.70 cm/s   SHUNTS MV E/A ratio:  1.34         Systemic VTI:  0.31 m Systemic Diam: 1.80 cm  Belva Crome MD Electronically signed by Belva Crome MD Signature Date/Time: 12/26/2021/9:47:07 AM    Final      CT SCANS  CT CORONARY MORPH W/CTA COR W/SCORE 01/01/2023  Addendum 01/06/2023 12:26 PM ADDENDUM REPORT: 01/06/2023 12:23  EXAM: OVER-READ INTERPRETATION  CT CHEST  The following report is an over-read performed by radiologist Dr. Joen Laura [facility] on 01/06/2023. This over-read does not include interpretation of cardiac or coronary anatomy or pathology. The coronary CTA interpretation by the cardiologist is attached.  COMPARISON:  06/05/2011  FINDINGS: Mediastinum: Visualized portions of the trachea and esophagus are unremarkable. Small hiatal hernia.  Lungs: No acute pleural or parenchymal lung disease.  Upper abdomen: No acute findings.  Musculoskeletal: No acute or destructive bony lesions. Reconstructed images demonstrate no additional findings.  IMPRESSION: 1. No acute or significant noncardiac findings.   Electronically Signed By: Sharlet Salina M.D. On: 01/06/2023 12:23  Narrative CLINICAL DATA:  Chest pain  EXAM: Cardiac/Coronary CTA  TECHNIQUE: A non-contrast, gated CT scan was obtained with axial slices of 3 mm through the heart for calcium scoring. Calcium scoring was performed using the Agatston method. A 120 kV prospective, gated, contrast cardiac scan was obtained. Gantry rotation speed was 250 msecs and collimation was 0.6 mm. Two sublingual nitroglycerin tablets (0.8 mg) were given. The 3D data set was reconstructed in 5% intervals of the 35-75% of the R-R cycle. Diastolic phases were analyzed on  a dedicated workstation using MPR, MIP, and VRT modes. The patient received 95 cc of contrast.  FINDINGS: Image quality: Excellent.  Noise artifact is: Limited.  Coronary Arteries:  Normal coronary origin.  Right dominance.  Left main: The left main is a large caliber vessel with a normal take off from the left coronary cusp that bifurcates to form a left anterior descending artery and a left circumflex artery. There is minimal calcified plaque in the distal LM into the ostial LAD with associated stenosis of < 25%.  Left anterior descending artery: The LAD gives off 1 patent diagonal branch. There is minimal calcified plaque in the ostial LAD with associated stenosis of <25%. There is mild mixed plaque in the proximal LAD with associated stenosis of 25-49%.  Left circumflex artery: The LCX is non-dominant and patent with no evidence of plaque or stenosis. The LCX gives off 3 patent obtuse marginal branches.  Right coronary artery: The RCA is dominant with normal take off from the right coronary cusp. There is minimal calcified plaque in the proximal RCA with associated stenosis of <25%. The RCA terminates as a PDA and right posterolateral branch without evidence of plaque or stenosis.  Right Atrium: Right atrial size is within normal limits.  Right Ventricle: The right ventricular cavity is within normal limits.  Left Atrium: Left atrial size is normal in size with no left atrial appendage filling defect.  Left Ventricle: The ventricular cavity size is within normal limits.  Pulmonary arteries: Normal in size.  Pulmonary veins: Normal pulmonary venous drainage.  Pericardium: Normal thickness without significant effusion or calcium present.  Cardiac valves: The aortic valve is trileaflet without significant calcification. The mitral valve is normal without significant calcification.  Aorta: Normal caliber without significant disease.  Small PFO.  Extra-cardiac  findings: See attached radiology report for non-cardiac structures.  IMPRESSION: 1. Coronary calcium score of 74.7. This was 81st percentile for age-, sex, and race-matched controls.  2. Total plaque volume 69 mm3 which is 29th percentile for age- and sex-matched controls (calcified plaque 28mm3; non-calcified plaque 81mm3). TPV is mild.  3. Normal coronary origin with right dominance.  4. Mild atherosclerosis (<25% distal LM/ostial LAD and proximal RCA, 25-49% proximal LAD).  5. Recommend prevent therapy and risk factor modification.  5. Consider non atherosclerotic causes of chest pain.  6. Small PFO  RECOMMENDATIONS: 1. CAD-RADS 0: No evidence of CAD (0%). Consider non-atherosclerotic causes of chest pain.  2. CAD-RADS 1: Minimal non-obstructive CAD (0-24%). Consider non-atherosclerotic causes of chest pain. Consider preventive therapy and risk factor modification.  3. CAD-RADS 2: Mild non-obstructive CAD (25-49%). Consider non-atherosclerotic causes of chest pain. Consider preventive therapy and risk factor modification.  4. CAD-RADS 3: Moderate stenosis. Consider symptom-guided anti-ischemic pharmacotherapy as well as risk  factor modification per guideline directed care. Additional analysis with CT FFR will be submitted.  5. CAD-RADS 4: Severe stenosis. (70-99% or > 50% left main). Cardiac catheterization or CT FFR is recommended. Consider symptom-guided anti-ischemic pharmacotherapy as well as risk factor modification per guideline directed care. Invasive coronary angiography recommended with revascularization per published guideline statements.  6. CAD-RADS 5: Total coronary occlusion (100%). Consider cardiac catheterization or viability assessment. Consider symptom-guided anti-ischemic pharmacotherapy as well as risk factor modification per guideline directed care.  7. CAD-RADS N: Non-diagnostic study. Obstructive CAD can't be excluded. Alternative  evaluation is recommended.  Armanda Magic, MD  Electronically Signed: By: Armanda Magic M.D. On: 01/01/2023 09:47     ______________________________________________________________________________________________          Recent Labs: No results found for requested labs within last 365 days.  Recent Lipid Panel    Component Value Date/Time   CHOL 150 02/21/2023 1039   TRIG 144 02/21/2023 1039   HDL 49 02/21/2023 1039   CHOLHDL 3.1 02/21/2023 1039   LDLCALC 76 02/21/2023 1039    Physical Exam:    VS:  BP (!) 142/94   Pulse 73   Ht 5\' 4"  (1.626 m)   Wt 204 lb 3.2 oz (92.6 kg)   LMP 03/11/2011   SpO2 97%   BMI 35.05 kg/m     Wt Readings from Last 3 Encounters:  08/20/23 204 lb 3.2 oz (92.6 kg)  02/21/23 205 lb (93 kg)  12/17/22 206 lb 6.4 oz (93.6 kg)     GEN:  Well nourished, well developed in no acute distress HEENT: Normal NECK: No JVD; No carotid bruits LYMPHATICS: No lymphadenopathy CARDIAC: RRR, no murmurs, rubs, gallops RESPIRATORY:  Clear to auscultation without rales, wheezing or rhonchi  ABDOMEN: Soft, non-tender, non-distended MUSCULOSKELETAL:  No edema; No deformity  SKIN: Warm and dry NEUROLOGIC:  Alert and oriented x 3 PSYCHIATRIC:  Normal affect    Signed, Norman Herrlich, MD  08/20/2023 12:03 PM    [facility]

## 2023-08-20 ENCOUNTER — Encounter: Payer: Self-pay | Admitting: Cardiology

## 2023-08-20 ENCOUNTER — Ambulatory Visit: Payer: Medicare Other | Attending: Cardiology | Admitting: Cardiology

## 2023-08-20 VITALS — BP 142/94 | HR 73 | Ht 64.0 in | Wt 204.2 lb

## 2023-08-20 DIAGNOSIS — E118 Type 2 diabetes mellitus with unspecified complications: Secondary | ICD-10-CM

## 2023-08-20 DIAGNOSIS — I2510 Atherosclerotic heart disease of native coronary artery without angina pectoris: Secondary | ICD-10-CM | POA: Diagnosis not present

## 2023-08-20 DIAGNOSIS — E782 Mixed hyperlipidemia: Secondary | ICD-10-CM | POA: Diagnosis not present

## 2023-08-20 DIAGNOSIS — I119 Hypertensive heart disease without heart failure: Secondary | ICD-10-CM

## 2023-08-20 NOTE — Patient Instructions (Signed)
 Medication Instructions:  Your physician recommends that you continue on your current medications as directed. Please refer to the Current Medication list given to you today.  *If you need a refill on your cardiac medications before your next appointment, please call your pharmacy*   Lab Work: Your physician recommends that you return for lab work in:   Labs today: CMP, Lipid, Apo B  If you have labs (blood work) drawn today and your tests are completely normal, you will receive your results only by: MyChart Message (if you have MyChart) OR A paper copy in the mail If you have any lab test that is abnormal or we need to change your treatment, we will call you to review the results.   Testing/Procedures: None   Follow-Up: At [facility], you and your health needs are our priority.  As part of our continuing mission to provide you with exceptional heart care, we have created designated Provider Care Teams.  These Care Teams include your primary Cardiologist (physician) and Advanced Practice Providers (APPs -  Physician Assistants and Nurse Practitioners) who all work together to provide you with the care you need, when you need it.  We recommend signing up for the patient portal called "MyChart".  Sign up information is provided on this After Visit Summary.  MyChart is used to connect with patients for Virtual Visits (Telemedicine).  Patients are able to view lab/test results, encounter notes, upcoming appointments, etc.  Non-urgent messages can be sent to your provider as well.   To learn more about what you can do with MyChart, go to [email or web address].    Your next appointment:   9 month(s)  Provider:   Norman Herrlich, MD    Other Instructions None

## 2023-08-21 ENCOUNTER — Telehealth: Payer: Self-pay | Admitting: Cardiology

## 2023-08-21 ENCOUNTER — Other Ambulatory Visit: Payer: Self-pay

## 2023-08-21 DIAGNOSIS — E785 Hyperlipidemia, unspecified: Secondary | ICD-10-CM

## 2023-08-21 LAB — COMPREHENSIVE METABOLIC PANEL
ALT: 18 IU/L (ref 0–32)
AST: 19 IU/L (ref 0–40)
Albumin: 4.2 g/dL (ref 3.9–4.9)
Alkaline Phosphatase: 101 IU/L (ref 44–121)
BUN/Creatinine Ratio: 17 (ref 12–28)
BUN: 13 mg/dL (ref 8–27)
Bilirubin Total: 0.3 mg/dL (ref 0.0–1.2)
CO2: 23 mmol/L (ref 20–29)
Calcium: 10 mg/dL (ref 8.7–10.3)
Chloride: 101 mmol/L (ref 96–106)
Creatinine, Ser: 0.78 mg/dL (ref 0.57–1.00)
Globulin, Total: 2.2 g/dL (ref 1.5–4.5)
Glucose: 120 mg/dL — ABNORMAL HIGH (ref 70–99)
Potassium: 4.2 mmol/L (ref 3.5–5.2)
Sodium: 141 mmol/L (ref 134–144)
Total Protein: 6.4 g/dL (ref 6.0–8.5)
eGFR: 83 mL/min/{1.73_m2} (ref >59)

## 2023-08-21 LAB — LIPID PANEL
Chol/HDL Ratio: 2.6 ratio (ref 0.0–4.4)
Cholesterol, Total: 140 mg/dL (ref 100–199)
HDL: 53 mg/dL (ref >39)
LDL Chol Calc (NIH): 71 mg/dL (ref 0–99)
Triglycerides: 84 mg/dL (ref 0–149)
VLDL Cholesterol Cal: 16 mg/dL (ref 5–40)

## 2023-08-21 LAB — APOLIPOPROTEIN B: Apolipoprotein B: 73 mg/dL (ref <90)

## 2023-08-21 MED ORDER — EZETIMIBE 10 MG PO TABS: 10.0000 mg | ORAL_TABLET | Freq: Every day | ORAL | 3 refills | Status: DC

## 2023-08-21 NOTE — Telephone Encounter (Signed)
 RX sent

## 2023-08-21 NOTE — Telephone Encounter (Signed)
*  STAT* If patient is at the pharmacy, call can be transferred to refill team.   1. Which medications need to be refilled? (please list name of each medication and dose if known) ezetimibe (ZETIA) 10 MG tablet   DIFFERENT PHARMACY   4. Which pharmacy/location (including street and city if local pharmacy) is medication to be sent to? [facility] - [place], [place] - [address] Phone: [phone]  Fax: [phone]       5. Do they need a 30 day or 90 day supply? 90

## 2023-10-30 ENCOUNTER — Other Ambulatory Visit: Payer: Self-pay | Admitting: Cardiology

## 2023-10-30 NOTE — Telephone Encounter (Signed)
 Rx refill sent to pharmacy.

## 2023-11-11 ENCOUNTER — Telehealth: Payer: Self-pay | Admitting: Cardiology

## 2023-11-11 MED ORDER — HYDRALAZINE HCL 25 MG PO TABS: 25.0000 mg | ORAL_TABLET | Freq: Three times a day (TID) | ORAL | 2 refills | Status: DC

## 2023-11-11 MED ORDER — CLONIDINE HCL 0.1 MG PO TABS
0.1000 mg | ORAL_TABLET | Freq: Two times a day (BID) | ORAL | 2 refills | Status: AC
Start: 2023-11-11 — End: ?

## 2023-11-11 NOTE — Telephone Encounter (Signed)
 Pt c/o medication issue:  1. Name of Medication:   hydrALAZINE  (APRESOLINE ) 50 MG tablet    2. How are you currently taking this medication (dosage and times per day)?    3. Are you having a reaction (difficulty breathing--STAT)? no  4. What is your medication issue? Patient calling in to say that its suppose to be 25mg  not 50mg . Please advise

## 2023-11-11 NOTE — Telephone Encounter (Signed)
 Medication list reconciled and refill sent.

## 2024-01-13 ENCOUNTER — Other Ambulatory Visit: Payer: Self-pay | Admitting: Cardiology

## 2024-03-23 ENCOUNTER — Other Ambulatory Visit: Payer: Self-pay | Admitting: Cardiology

## 2024-04-06 ENCOUNTER — Telehealth: Payer: Self-pay

## 2024-04-06 NOTE — Telephone Encounter (Signed)
 Left message to call back to schedule sooner appt in office she needs preop clearance as well. I found a sooner appt with Dr. Monetta 04/16/24.   I saw the pt is on ASA. I have called the requesting office to confirm if they need ASA to be held as it seems to have not been on the clearance form.  I confirmed the requesting office reached out to PCP about ASA x7 days hold. We will need to provide cardiac clearance only at this time. I did state that I left a message for the pt to call back and schedule sooner appt in office.

## 2024-04-06 NOTE — Progress Notes (Signed)
 Cardiology Office Note   Date:  04/09/2024  ID:  Catherine Maxwell, DOB [birth year], MRN [id number] PCP: Silver Lamar LABOR, MD  [facility] Providers Cardiologist:  Redell Leiter, MD     History of Present Illness Catherine Maxwell is a 68 y.o. female with a past medical history of nonobstructive CAD, PFO, hypertension, OSA, GERD, DM2  01/01/2023 coronary CTA calcium  score of 74.7, 81st percentile 12/26/2021 echo EF 60 to 65%, mild LVH, mildly elevated PASP 11/03/2018 MPI normal, low risk 11/11/2013 renal artery duplex negative for stenosis  She established care with Dr. Leiter in 2019 at the behest of her PCP for the evaluation and management of hypertension that have been hard to control.  She had apparently undergone a renal artery duplex in 2015 which was negative for stenosis.  She had an MPI in 2020 which was a normal, low risk study.  Echo in 2023 revealed a preserved EF.  In 2024 she began having episodes of chest pain, coronary CTA was arranged revealing mild nonobstructive CAD.  Most recently she was evaluated by Dr. Leiter on 08/20/2023 stable from a cardiac perspective no changes made to medications or plan of care and she was advised to follow-up in 6 months.  She presents today for preoperative evaluation for upcoming back surgery.  She has been doing well from a cardiac perspective, no formal complaints.  [facility] and spite of ongoing back pain.  Her weight is down close to 20 pounds through making lifestyle changes and she is feeling good overall. She denies chest pain, palpitations, dyspnea, pnd, orthopnea, n, v, dizziness, syncope, edema, weight gain, or early satiety.  ROS: Review of Systems  Musculoskeletal:  Positive for back pain.  All other systems reviewed and are negative.    Studies Reviewed EKG Interpretation Date/Time:  Friday April 09 2024 09:00:27 EDT Ventricular Rate:  63 PR Interval:  158 QRS Duration:  76 QT  Interval:  438 QTC Calculation: 448 R Axis:   72  Text Interpretation: Normal sinus rhythm No significant change was found Confirmed by Carlin Nest [phone]) on 04/09/2024 9:08:54 AM    Cardiac Studies & Procedures   ______________________________________________________________________________________________   STRESS TESTS  MYOCARDIAL PERFUSION IMAGING 11/03/2018  Interpretation Summary  The left ventricular ejection fraction is hyperdynamic (>65%).  Nuclear stress EF: 68%.  There was no ST segment deviation noted during stress.  The study is normal.  This is a low risk study.  Apical thinning   ECHOCARDIOGRAM  ECHOCARDIOGRAM COMPLETE 12/26/2021  Narrative ECHOCARDIOGRAM REPORT    Patient Name:   Catherine Maxwell Date of Exam: 12/26/2021 Medical Rec #:  [id number]        Height:       64.5 in Accession #:    [id number]       Weight:       208.0 lb Date of Birth:  02-Mar-1956        BSA:          2.000 m Patient Age:    66 years         BP:           146/84 mmHg Patient Gender: F                HR:           60 bpm. Exam Location:  [place]  Procedure: 2D Echo, Color Doppler, Cardiac Doppler and Strain Analysis  Indications:  Hypertensive heart disease without heart failure [I11.9 (ICD-10-CM)]; Type 2 diabetes mellitus with complication, without long-term current use of insulin  (HCC) [E11.8 (ICD-10-CM)]; Hyperlipidemia, unspecified hyperlipidemia type [E78.5 (ICD-10-CM)]  History:        Patient has prior history of Echocardiogram examinations, most recent 11/11/2013. Arrythmias:Bradycardia; Risk Factors:Hypertension.  Sonographer:    Charlie Jointer RDCS Referring Phys: [id number] BRIAN J MUNLEY  IMPRESSIONS   1. Left ventricular ejection fraction, by estimation, is 60 to 65%. The left ventricle has normal function. The left ventricle has no regional wall motion abnormalities. There is mild left ventricular hypertrophy. Left ventricular diastolic  parameters were normal. 2. Right ventricular systolic function is normal. The right ventricular size is normal. There is mildly elevated pulmonary artery systolic pressure. 3. Left atrial size was mildly dilated. 4. The mitral valve is normal in structure. No evidence of mitral valve regurgitation. No evidence of mitral stenosis. 5. The aortic valve is normal in structure. Aortic valve regurgitation is not visualized. No aortic stenosis is present. 6. The inferior vena cava is normal in size with greater than 50% respiratory variability, suggesting right atrial pressure of 3 mmHg.  FINDINGS Left Ventricle: Left ventricular ejection fraction, by estimation, is 60 to 65%. The left ventricle has normal function. The left ventricle has no regional wall motion abnormalities. The left ventricular internal cavity size was normal in size. There is mild left ventricular hypertrophy. Left ventricular diastolic parameters were normal.  Right Ventricle: The right ventricular size is normal. No increase in right ventricular wall thickness. Right ventricular systolic function is normal. There is mildly elevated pulmonary artery systolic pressure. The tricuspid regurgitant velocity is 2.93 m/s, and with an assumed right atrial pressure of 3 mmHg, the estimated right ventricular systolic pressure is 37.3 mmHg.  Left Atrium: Left atrial size was mildly dilated.  Right Atrium: Right atrial size was normal in size.  Pericardium: There is no evidence of pericardial effusion.  Mitral Valve: The mitral valve is normal in structure. No evidence of mitral valve regurgitation. No evidence of mitral valve stenosis.  Tricuspid Valve: The tricuspid valve is normal in structure. Tricuspid valve regurgitation is not demonstrated. No evidence of tricuspid stenosis.  Aortic Valve: The aortic valve is normal in structure. Aortic valve regurgitation is not visualized. No aortic stenosis is present.  Pulmonic Valve: The  pulmonic valve was normal in structure. Pulmonic valve regurgitation is not visualized. No evidence of pulmonic stenosis.  Aorta: The aortic root is normal in size and structure.  Venous: The inferior vena cava is normal in size with greater than 50% respiratory variability, suggesting right atrial pressure of 3 mmHg.  IAS/Shunts: No atrial level shunt detected by color flow Doppler.   LEFT VENTRICLE PLAX 2D LVIDd:         3.90 cm   Diastology LVIDs:         2.50 cm   LV e' medial:    6.31 cm/s LV PW:         1.30 cm   LV E/e' medial:  17.7 LV IVS:        1.50 cm   LV e' lateral:   7.18 cm/s LVOT diam:     1.80 cm   LV E/e' lateral: 15.6 LV SV:         78 LV SV Index:   39 LVOT Area:     2.54 cm   RIGHT VENTRICLE             IVC RV Basal  diam:  2.90 cm     IVC diam: 1.60 cm RV S prime:     13.80 cm/s TAPSE (M-mode): 2.7 cm  LEFT ATRIUM             Index        RIGHT ATRIUM           Index LA diam:        3.90 cm 1.95 cm/m   RA Area:     12.80 cm LA Vol (A2C):   58.8 ml 29.40 ml/m  RA Volume:   28.50 ml  14.25 ml/m LA Vol (A4C):   60.5 ml 30.25 ml/m LA Biplane Vol: 60.3 ml 30.15 ml/m AORTIC VALVE LVOT Vmax:   142.00 cm/s LVOT Vmean:  90.100 cm/s LVOT VTI:    0.307 m  AORTA Ao Root diam: 2.80 cm Ao Asc diam:  3.30 cm Ao Desc diam: 2.10 cm  MITRAL VALVE                TRICUSPID VALVE MV Area (PHT): 4.68 cm     TR Peak grad:   34.3 mmHg MV Decel Time: 162 msec     TR Vmax:        293.00 cm/s MV E velocity: 112.00 cm/s MV A velocity: 83.70 cm/s   SHUNTS MV E/A ratio:  1.34         Systemic VTI:  0.31 m Systemic Diam: 1.80 cm  Jacoya Bauman Crape MD Electronically signed by Marshia Tropea Crape MD Signature Date/Time: 12/26/2021/9:47:07 AM    Final      CT SCANS  CT CORONARY MORPH W/CTA COR W/SCORE 01/01/2023  Addendum 01/06/2023 12:26 PM ADDENDUM REPORT: 01/06/2023 12:23  EXAM: OVER-READ INTERPRETATION  CT CHEST  The following report is an over-read performed  by radiologist Dr. Ozell Medal [facility] on 01/06/2023. This over-read does not include interpretation of cardiac or coronary anatomy or pathology. The coronary CTA interpretation by the cardiologist is attached.  COMPARISON:  06/05/2011  FINDINGS: Mediastinum: Visualized portions of the trachea and esophagus are unremarkable. Small hiatal hernia.  Lungs: No acute pleural or parenchymal lung disease.  Upper abdomen: No acute findings.  Musculoskeletal: No acute or destructive bony lesions. Reconstructed images demonstrate no additional findings.  IMPRESSION: 1. No acute or significant noncardiac findings.   Electronically Signed By: Ozell Daring M.D. On: 01/06/2023 12:23  Narrative CLINICAL DATA:  Chest pain  EXAM: Cardiac/Coronary CTA  TECHNIQUE: A non-contrast, gated CT scan was obtained with axial slices of 3 mm through the heart for calcium  scoring. Calcium  scoring was performed using the Agatston method. A 120 kV prospective, gated, contrast cardiac scan was obtained. Gantry rotation speed was 250 msecs and collimation was 0.6 mm. Two sublingual nitroglycerin  tablets (0.8 mg) were given. The 3D data set was reconstructed in 5% intervals of the 35-75% of the R-R cycle. Diastolic phases were analyzed on a dedicated workstation using MPR, MIP, and VRT modes. The patient received 95 cc of contrast.  FINDINGS: Image quality: Excellent.  Noise artifact is: Limited.  Coronary Arteries:  Normal coronary origin.  Right dominance.  Left main: The left main is a large caliber vessel with a normal take off from the left coronary cusp that bifurcates to form a left anterior descending artery and a left circumflex artery. There is minimal calcified plaque in the distal LM into the ostial LAD with associated stenosis of < 25%.  Left anterior descending artery: The LAD gives off 1 patent diagonal branch.  There is minimal calcified plaque in the  ostial LAD with associated stenosis of <25%. There is mild mixed plaque in the proximal LAD with associated stenosis of 25-49%.  Left circumflex artery: The LCX is non-dominant and patent with no evidence of plaque or stenosis. The LCX gives off 3 patent obtuse marginal branches.  Right coronary artery: The RCA is dominant with normal take off from the right coronary cusp. There is minimal calcified plaque in the proximal RCA with associated stenosis of <25%. The RCA terminates as a PDA and right posterolateral branch without evidence of plaque or stenosis.  Right Atrium: Right atrial size is within normal limits.  Right Ventricle: The right ventricular cavity is within normal limits.  Left Atrium: Left atrial size is normal in size with no left atrial appendage filling defect.  Left Ventricle: The ventricular cavity size is within normal limits.  Pulmonary arteries: Normal in size.  Pulmonary veins: Normal pulmonary venous drainage.  Pericardium: Normal thickness without significant effusion or calcium  present.  Cardiac valves: The aortic valve is trileaflet without significant calcification. The mitral valve is normal without significant calcification.  Aorta: Normal caliber without significant disease.  Small PFO.  Extra-cardiac findings: See attached radiology report for non-cardiac structures.  IMPRESSION: 1. Coronary calcium  score of 74.7. This was 81st percentile for age-, sex, and race-matched controls.  2. Total plaque volume 69 mm3 which is 29th percentile for age- and sex-matched controls (calcified plaque 83mm3; non-calcified plaque 71mm3). TPV is mild.  3. Normal coronary origin with right dominance.  4. Mild atherosclerosis (<25% distal LM/ostial LAD and proximal RCA, 25-49% proximal LAD).  5. Recommend prevent therapy and risk factor modification.  5. Consider non atherosclerotic causes of chest pain.  6. Small PFO  RECOMMENDATIONS: 1.  CAD-RADS 0: No evidence of CAD (0%). Consider non-atherosclerotic causes of chest pain.  2. CAD-RADS 1: Minimal non-obstructive CAD (0-24%). Consider non-atherosclerotic causes of chest pain. Consider preventive therapy and risk factor modification.  3. CAD-RADS 2: Mild non-obstructive CAD (25-49%). Consider non-atherosclerotic causes of chest pain. Consider preventive therapy and risk factor modification.  4. CAD-RADS 3: Moderate stenosis. Consider symptom-guided anti-ischemic pharmacotherapy as well as risk factor modification per guideline directed care. Additional analysis with CT FFR will be submitted.  5. CAD-RADS 4: Severe stenosis. (70-99% or > 50% left main). Cardiac catheterization or CT FFR is recommended. Consider symptom-guided anti-ischemic pharmacotherapy as well as risk factor modification per guideline directed care. Invasive coronary angiography recommended with revascularization per published guideline statements.  6. CAD-RADS 5: Total coronary occlusion (100%). Consider cardiac catheterization or viability assessment. Consider symptom-guided anti-ischemic pharmacotherapy as well as risk factor modification per guideline directed care.  7. CAD-RADS N: Non-diagnostic study. Obstructive CAD can't be excluded. Alternative evaluation is recommended.  Wilbert Bihari, MD  Electronically Signed: By: Wilbert Bihari M.D. On: 01/01/2023 09:47     ______________________________________________________________________________________________      Risk Assessment/Calculations           Physical Exam VS:  BP 120/78   Pulse 63   Ht 5' 4 (1.626 m)   Wt 197 lb 6.4 oz (89.5 kg)   LMP 03/11/2011   SpO2 98%   BMI 33.88 kg/m        Wt Readings from Last 3 Encounters:  04/09/24 197 lb 6.4 oz (89.5 kg)  08/20/23 204 lb 3.2 oz (92.6 kg)  02/21/23 205 lb (93 kg)    GEN: Well nourished, well developed in no acute distress NECK: No JVD; No  carotid bruits CARDIAC:  RRR, no murmurs, rubs, gallops RESPIRATORY:  Clear to auscultation without rales, wheezing or rhonchi  ABDOMEN: Soft, non-tender, non-distended EXTREMITIES:  No edema; No deformity   ASSESSMENT AND PLAN CAD -nonobstructive per coronary CTA, Stable with no anginal symptoms. No indication for ischemic evaluation.  She is very physically active working out most days of the week.  Continue aspirin 81 mg daily, continue Lipitor 40 mg daily, Zetia  10 mg daily.  Hypertension -blood pressure is well-controlled continue Avalide 300-12.5 mg daily, hydralazine  25 mg 3 times daily, spironolactone  25 mg daily, clonidine  0.1 mg twice daily.  DM2 - most recent A1C 6.7, well controlled, followed by PCP.  Dyslipidemia-on Lipitor 40 mg daily, Zetia  10 mg daily, most recent LDL is controlled at 71, followed by PCP.   Preoperative cardiovascular evaluation- According to the Revised Cardiac Risk Index (RCRI), her Perioperative Risk of Major Cardiac Event is (%): 0.4 Her Functional Capacity in METs is: 6.61 according to the [facility] Activity Status Index (DASI). Therefore, based on ACC/AHA guidelines, patient would be at acceptable risk for the planned procedure without further cardiovascular testing. I will route this recommendation to the requesting party via Epic fax function.  Recommendations for holding aspirin have already been addressed by the PCP.       Dispo: Good, follow-up in 6 months.  Signed, Delon JAYSON Hoover, NP

## 2024-04-06 NOTE — Telephone Encounter (Signed)
 Patient called back and has been scheduled for a sooner appointment

## 2024-04-06 NOTE — Telephone Encounter (Signed)
 Patient returned Pre-op call and scheduled sooner appointment with Dr. Monetta on 10/24.

## 2024-04-06 NOTE — Telephone Encounter (Signed)
   Name: Catherine Maxwell  DOB: 08/05/1955  MRN: [id number]  Primary Cardiologist: None  Chart reviewed as part of pre-operative protocol coverage. Because of [name]'s past medical history and time since last visit, she will require a follow-up in-office visit in order to better assess preoperative cardiovascular risk.  Pre-op covering staff: - Please schedule appointment and call patient to inform them. If patient already had an upcoming appointment within acceptable timeframe, please add pre-op clearance to the appointment notes so provider is aware. - Please contact requesting surgeon's office via preferred method (i.e, phone, fax) to inform them of need for appointment prior to surgery.  No medication indicated as needing held.   [name], PA-C  04/06/2024, 8:58 AM

## 2024-04-06 NOTE — Telephone Encounter (Signed)
   Pre-operative Risk Assessment    Patient Name: Catherine Maxwell  DOB: [birth year] MRN: [id number]   Date of last office visit: 08/20/23 Date of next office visit: 05/17/2024   Request for Surgical Clearance    Procedure:  Lumbar Laminectomy open single level  Date of Surgery:  Clearance 04/20/24                                Surgeon:  Dr. Tess Cramp Surgeon's Group or Practice Name:  [facility] Phone number:  [phone] Fax number:  [phone]   Type of Clearance Requested:   - Medical    Type of Anesthesia:  General    Additional requests/questions:    Bonney Calvert Pouch   04/06/2024, 8:53 AM

## 2024-04-09 ENCOUNTER — Ambulatory Visit: Attending: Cardiology | Admitting: Cardiology

## 2024-04-09 ENCOUNTER — Encounter: Payer: Self-pay | Admitting: Cardiology

## 2024-04-09 VITALS — BP 120/78 | HR 63 | Ht 64.0 in | Wt 197.4 lb

## 2024-04-09 DIAGNOSIS — I2510 Atherosclerotic heart disease of native coronary artery without angina pectoris: Secondary | ICD-10-CM | POA: Diagnosis not present

## 2024-04-09 DIAGNOSIS — E782 Mixed hyperlipidemia: Secondary | ICD-10-CM

## 2024-04-09 DIAGNOSIS — I10 Essential (primary) hypertension: Secondary | ICD-10-CM

## 2024-04-09 DIAGNOSIS — Z01818 Encounter for other preprocedural examination: Secondary | ICD-10-CM

## 2024-04-09 DIAGNOSIS — E118 Type 2 diabetes mellitus with unspecified complications: Secondary | ICD-10-CM

## 2024-04-09 NOTE — Patient Instructions (Signed)
 Medication Instructions:  Your physician recommends that you continue on your current medications as directed. Please refer to the Current Medication list given to you today.  *If you need a refill on your cardiac medications before your next appointment, please call your pharmacy*  Lab Work: NONE If you have labs (blood work) drawn today and your tests are completely normal, you will receive your results only by: MyChart Message (if you have MyChart) OR A paper copy in the mail If you have any lab test that is abnormal or we need to change your treatment, we will call you to review the results.  Testing/Procedures: NONE  Follow-Up: At [facility], you and your health needs are our priority.  As part of our continuing mission to provide you with exceptional heart care, our providers are all part of one team.  This team includes your primary Cardiologist (physician) and Advanced Practice Providers or APPs (Physician Assistants and Nurse Practitioners) who all work together to provide you with the care you need, when you need it.  Your next appointment:   6 month(s)  Provider:   Norman Herrlich, MD    We recommend signing up for the patient portal called "MyChart".  Sign up information is provided on this After Visit Summary.  MyChart is used to connect with patients for Virtual Visits (Telemedicine).  Patients are able to view lab/test results, encounter notes, upcoming appointments, etc.  Non-urgent messages can be sent to your provider as well.   To learn more about what you can do with MyChart, go to [email or web address].   Other Instructions

## 2024-04-13 ENCOUNTER — Other Ambulatory Visit: Payer: Self-pay | Admitting: Cardiology

## 2024-04-16 ENCOUNTER — Ambulatory Visit: Admitting: Cardiology

## 2024-05-17 ENCOUNTER — Ambulatory Visit: Admitting: Cardiology

## 2024-06-03 ENCOUNTER — Other Ambulatory Visit: Payer: Self-pay | Admitting: Cardiology

## 2024-07-07 ENCOUNTER — Other Ambulatory Visit: Payer: Self-pay | Admitting: Cardiology

## 2024-07-07 NOTE — Telephone Encounter (Signed)
 In accordance with refill protocols, please review and address the following requirements before this medication refill can be authorized:  Labs

## 2024-07-09 NOTE — Telephone Encounter (Signed)
 In [facility] many the patient's are not in epic or if they are in epic they belong to [facility].  She had labs done in October.
